# Patient Record
Sex: Male | Born: 1935 | Race: Black or African American | Hispanic: No | Marital: Married
Health system: Midwestern US, Academic
[De-identification: ages and names within clinical notes are randomized; demographics above are authoritative.]

---

## 2017-06-25 ENCOUNTER — Encounter: Admit: 2017-06-25 | Discharge: 2017-06-25 | Payer: MEDICARE

## 2017-06-25 ENCOUNTER — Ambulatory Visit: Admit: 2017-06-25 | Discharge: 2017-06-25 | Payer: MEDICARE

## 2017-06-25 DIAGNOSIS — N281 Cyst of kidney, acquired: Principal | ICD-10-CM

## 2017-06-25 DIAGNOSIS — N401 Enlarged prostate with lower urinary tract symptoms: ICD-10-CM

## 2017-06-25 DIAGNOSIS — Q540 Hypospadias, balanic: ICD-10-CM

## 2017-06-25 DIAGNOSIS — I2510 Atherosclerotic heart disease of native coronary artery without angina pectoris: ICD-10-CM

## 2017-06-25 DIAGNOSIS — F209 Schizophrenia, unspecified: ICD-10-CM

## 2017-06-25 DIAGNOSIS — E119 Type 2 diabetes mellitus without complications: ICD-10-CM

## 2017-06-25 DIAGNOSIS — K635 Polyp of colon: ICD-10-CM

## 2017-06-25 DIAGNOSIS — I10 Essential (primary) hypertension: ICD-10-CM

## 2017-06-25 DIAGNOSIS — M549 Dorsalgia, unspecified: ICD-10-CM

## 2017-06-25 DIAGNOSIS — E669 Obesity, unspecified: ICD-10-CM

## 2017-06-25 DIAGNOSIS — M1990 Unspecified osteoarthritis, unspecified site: Secondary | ICD-10-CM

## 2017-06-25 DIAGNOSIS — I509 Heart failure, unspecified: ICD-10-CM

## 2017-06-25 DIAGNOSIS — F319 Bipolar disorder, unspecified: ICD-10-CM

## 2017-06-25 DIAGNOSIS — D649 Anemia, unspecified: ICD-10-CM

## 2017-06-25 DIAGNOSIS — D696 Thrombocytopenia, unspecified: Principal | ICD-10-CM

## 2017-06-25 DIAGNOSIS — J189 Pneumonia, unspecified organism: ICD-10-CM

## 2017-06-25 DIAGNOSIS — N400 Enlarged prostate without lower urinary tract symptoms: ICD-10-CM

## 2017-06-25 DIAGNOSIS — R161 Splenomegaly, not elsewhere classified: ICD-10-CM

## 2017-06-25 DIAGNOSIS — Z9861 Coronary angioplasty status: ICD-10-CM

## 2017-06-25 DIAGNOSIS — E785 Hyperlipidemia, unspecified: ICD-10-CM

## 2017-06-29 NOTE — Progress Notes
Date of Service: 06/25/2017     Subjective:             Glenn Montgomery is a 83 y.o. male.    History of Present Illness  Here for follow up of renal mass    Doing well  Denies complaints  Denies pain, hematuria, dysuria  Denies fatigue, wt loss         Review of Systems   Constitutional: Negative for activity change, appetite change, chills and fever.   Respiratory: Negative for cough and shortness of breath.    Cardiovascular: Negative for chest pain and leg swelling.   Gastrointestinal: Negative for abdominal pain, constipation and diarrhea.   Genitourinary: Negative for dysuria, enuresis, flank pain, frequency, hematuria, penile pain and testicular pain.   Musculoskeletal: Negative for joint swelling.   Skin: Negative for rash and wound.   Neurological: Negative for syncope, weakness, light-headedness and headaches.   Hematological: Negative for adenopathy.   Psychiatric/Behavioral: Negative for confusion.         Objective:         ??? acetaminophen (TYLENOL EXTRA STRENGTH) 500 mg tablet Take 500 mg by mouth twice daily as needed for Pain.   ??? amiodarone (CORDARONE) 200 mg tablet Take 200 mg by mouth daily.   ??? aspirin 81 mg chewable tablet Take 81 mg by mouth daily.   ??? dextran 70/hypromellose (NATURAL BALANCE TEARS) 0.1/0.3 % ophthalmic solution Place 1 Drop into or around eye(s) as Needed.   ??? docusate (COLACE) 100 mg capsule Take 200 mg by mouth daily as needed for Constipation.   ??? fluticasone (FLONASE) 50 mcg/actuation nasal spray Apply 2 Sprays to each nostril as directed daily. Shake bottle gently before using.   ??? furosemide (LASIX) 40 mg tablet Take 40 mg by mouth daily.   ??? gabapentin (NEURONTIN) 100 mg capsule Take 100 mg by mouth three times daily.   ??? HELP MEDICATION KCL 20Meq/52ml.  4 tablespoons daily   ??? hydrALAZINE (APRESOLINE) 10 mg tablet Take 10 mg by mouth three times daily. Indications: take one tab when bp is greater than 160/95   ??? losartan (COZAAR) 25 mg tablet ??? LOSARTAN PO Take  by mouth.   ??? melatonin(+) 5 mg TbDL rapid dissolve tablet Dissolve 5 mg by mouth as Needed.   ??? metoprolol XL (TOPROL XL) 100 mg tablet Take 100 mg by mouth daily.   ??? mirtazapine (REMERON) 15 mg tablet Take 15 mg by mouth at bedtime daily.   ??? multivit-min-FA-lycopen-lutein (CENTRUM SILVER ULTRA MEN'S) 300-600-300 mcg tab Take 1 Tab by mouth daily.   ??? Omega-3 Fatty Acids-Vitamin E (FISH OIL) 1,000 mg cap Take 1 Cap by mouth twice daily.   ??? other medication Take 1 Dose by mouth as Needed. Medication Name & Strength:thicket  Dose(how many): powder    Frequency(how often): as needed with liquids   Indications: needs to have thickner in liquids to help with dysphagia   ??? Potassium Chloride 40 mEq/15 mL liqd Take 40 mEq by mouth twice daily with meals.     Vitals:    06/25/17 1344   BP: 131/68   Pulse: 64   Weight: 86.2 kg (190 lb)   Height: 172.7 cm (67.99)     Body mass index is 28.9 kg/m???.     Physical Exam   Constitutional: He is oriented to person, place, and time. No distress.   Eyes: Pupils are equal, round, and reactive to light.   Neck: Neck supple.  Cardiovascular: Normal rate and regular rhythm.    Pulmonary/Chest: No respiratory distress.   Abdominal: Soft. He exhibits no distension and no mass. There is no tenderness.   Musculoskeletal: He exhibits no edema.   Neurological: He is alert and oriented to person, place, and time.   Skin: Skin is warm. No erythema.            Assessment and Plan:    Problem   Renal Cysts, Acquired, Bilateral    Renal/ Bladder US (06/15/2015): incidental (B) renal cysts.  (L) hypoechoic cystic lesion 3.2 cm, 1.8 cm complex cyst.  (R) 2.5 cm cyst with increased echogenicity, 3.2 cm mass with internal echoes, mild peripheral vascularity.    No recent imaging         Renal cysts, acquired, bilateral  Overall well  Will obtain CT next available  Will call with results        Daryl Eastern, MD

## 2017-06-29 NOTE — Assessment & Plan Note
Overall well  Will obtain CT next available  Will call with results

## 2017-07-12 ENCOUNTER — Ambulatory Visit: Admit: 2017-07-12 | Discharge: 2017-07-12 | Payer: MEDICARE

## 2017-07-12 DIAGNOSIS — N281 Cyst of kidney, acquired: Principal | ICD-10-CM

## 2017-07-12 LAB — POC CREATININE, RAD: Lab: 1.2 mg/dL (ref 0.4–1.24)

## 2017-07-12 MED ORDER — IOPAMIDOL 76 % IV SOLN
80 mL | Freq: Once | INTRAVENOUS | 0 refills | Status: CP
Start: 2017-07-12 — End: ?
  Administered 2017-07-12: 21:00:00 80 mL via INTRAVENOUS

## 2017-07-12 MED ORDER — SODIUM CHLORIDE 0.9 % IJ SOLN
50 mL | Freq: Once | INTRAVENOUS | 0 refills | Status: CP
Start: 2017-07-12 — End: ?
  Administered 2017-07-12: 21:00:00 50 mL via INTRAVENOUS

## 2017-07-19 ENCOUNTER — Encounter: Admit: 2017-07-19 | Discharge: 2017-07-19 | Payer: MEDICARE

## 2017-07-19 DIAGNOSIS — N2889 Other specified disorders of kidney and ureter: Principal | ICD-10-CM

## 2017-07-19 NOTE — Telephone Encounter
I reviewed the results with the patient and his wife.  There has been interval growth of the tumors.  We reviewed observation, biopsy, and treatment.  He has elected to continue observation.  RTC 6 months with CT AP.

## 2018-03-25 ENCOUNTER — Encounter: Admit: 2018-03-25 | Discharge: 2018-03-25 | Payer: MEDICARE

## 2018-03-25 ENCOUNTER — Ambulatory Visit: Admit: 2018-03-25 | Discharge: 2018-03-25 | Payer: MEDICARE

## 2018-03-25 DIAGNOSIS — R161 Splenomegaly, not elsewhere classified: ICD-10-CM

## 2018-03-25 DIAGNOSIS — N401 Enlarged prostate with lower urinary tract symptoms: ICD-10-CM

## 2018-03-25 DIAGNOSIS — E669 Obesity, unspecified: ICD-10-CM

## 2018-03-25 DIAGNOSIS — D696 Thrombocytopenia, unspecified: Principal | ICD-10-CM

## 2018-03-25 DIAGNOSIS — I10 Essential (primary) hypertension: ICD-10-CM

## 2018-03-25 DIAGNOSIS — K635 Polyp of colon: ICD-10-CM

## 2018-03-25 DIAGNOSIS — M1990 Unspecified osteoarthritis, unspecified site: ICD-10-CM

## 2018-03-25 DIAGNOSIS — I509 Heart failure, unspecified: ICD-10-CM

## 2018-03-25 DIAGNOSIS — Q540 Hypospadias, balanic: ICD-10-CM

## 2018-03-25 DIAGNOSIS — N281 Cyst of kidney, acquired: Principal | ICD-10-CM

## 2018-03-25 DIAGNOSIS — E119 Type 2 diabetes mellitus without complications: ICD-10-CM

## 2018-03-25 DIAGNOSIS — N400 Enlarged prostate without lower urinary tract symptoms: ICD-10-CM

## 2018-03-25 DIAGNOSIS — J189 Pneumonia, unspecified organism: ICD-10-CM

## 2018-03-25 DIAGNOSIS — F319 Bipolar disorder, unspecified: ICD-10-CM

## 2018-03-25 DIAGNOSIS — E785 Hyperlipidemia, unspecified: ICD-10-CM

## 2018-03-25 DIAGNOSIS — F209 Schizophrenia, unspecified: ICD-10-CM

## 2018-03-25 DIAGNOSIS — N2889 Other specified disorders of kidney and ureter: Principal | ICD-10-CM

## 2018-03-25 DIAGNOSIS — M549 Dorsalgia, unspecified: ICD-10-CM

## 2018-03-25 DIAGNOSIS — D649 Anemia, unspecified: ICD-10-CM

## 2018-03-25 DIAGNOSIS — I2510 Atherosclerotic heart disease of native coronary artery without angina pectoris: ICD-10-CM

## 2018-03-25 DIAGNOSIS — Z9861 Coronary angioplasty status: ICD-10-CM

## 2018-03-25 LAB — POC CREATININE, RAD: Lab: 1.3 mg/dL — ABNORMAL HIGH (ref 0.4–1.24)

## 2018-03-25 MED ORDER — SODIUM CHLORIDE 0.9 % IJ SOLN
50 mL | Freq: Once | INTRAVENOUS | 0 refills | Status: CP
Start: 2018-03-25 — End: ?
  Administered 2018-03-25: 17:00:00 50 mL via INTRAVENOUS

## 2018-03-25 MED ORDER — IOHEXOL 350 MG IODINE/ML IV SOLN
100 mL | Freq: Once | INTRAVENOUS | 0 refills | Status: CP
Start: 2018-03-25 — End: ?
  Administered 2018-03-25: 17:00:00 100 mL via INTRAVENOUS

## 2018-03-26 ENCOUNTER — Encounter: Admit: 2018-03-26 | Discharge: 2018-03-26 | Payer: MEDICARE

## 2018-09-24 ENCOUNTER — Encounter: Admit: 2018-09-24 | Discharge: 2018-09-24 | Payer: MEDICARE

## 2018-10-01 ENCOUNTER — Encounter: Admit: 2018-10-01 | Discharge: 2018-10-01 | Payer: MEDICARE

## 2018-10-01 ENCOUNTER — Ambulatory Visit: Admit: 2018-10-01 | Discharge: 2018-10-01 | Payer: MEDICARE

## 2018-10-01 DIAGNOSIS — K635 Polyp of colon: ICD-10-CM

## 2018-10-01 DIAGNOSIS — N401 Enlarged prostate with lower urinary tract symptoms: ICD-10-CM

## 2018-10-01 DIAGNOSIS — F319 Bipolar disorder, unspecified: ICD-10-CM

## 2018-10-01 DIAGNOSIS — Z9861 Coronary angioplasty status: ICD-10-CM

## 2018-10-01 DIAGNOSIS — J189 Pneumonia, unspecified organism: ICD-10-CM

## 2018-10-01 DIAGNOSIS — Q540 Hypospadias, balanic: ICD-10-CM

## 2018-10-01 DIAGNOSIS — I2510 Atherosclerotic heart disease of native coronary artery without angina pectoris: ICD-10-CM

## 2018-10-01 DIAGNOSIS — N2889 Other specified disorders of kidney and ureter: ICD-10-CM

## 2018-10-01 DIAGNOSIS — F209 Schizophrenia, unspecified: ICD-10-CM

## 2018-10-01 DIAGNOSIS — D696 Thrombocytopenia, unspecified: Principal | ICD-10-CM

## 2018-10-01 DIAGNOSIS — E669 Obesity, unspecified: ICD-10-CM

## 2018-10-01 DIAGNOSIS — N400 Enlarged prostate without lower urinary tract symptoms: ICD-10-CM

## 2018-10-01 DIAGNOSIS — D649 Anemia, unspecified: ICD-10-CM

## 2018-10-01 DIAGNOSIS — R161 Splenomegaly, not elsewhere classified: ICD-10-CM

## 2018-10-01 DIAGNOSIS — E119 Type 2 diabetes mellitus without complications: ICD-10-CM

## 2018-10-01 DIAGNOSIS — I10 Essential (primary) hypertension: ICD-10-CM

## 2018-10-01 DIAGNOSIS — E785 Hyperlipidemia, unspecified: ICD-10-CM

## 2018-10-01 DIAGNOSIS — N281 Cyst of kidney, acquired: Principal | ICD-10-CM

## 2018-10-01 DIAGNOSIS — M1990 Unspecified osteoarthritis, unspecified site: Secondary | ICD-10-CM

## 2018-10-01 DIAGNOSIS — I509 Heart failure, unspecified: ICD-10-CM

## 2018-10-01 DIAGNOSIS — M549 Dorsalgia, unspecified: ICD-10-CM

## 2018-10-01 LAB — POC CREATININE, RAD: Lab: 1.3 mg/dL — ABNORMAL HIGH (ref 0.4–1.24)

## 2018-10-01 MED ORDER — SODIUM CHLORIDE 0.9 % IJ SOLN
50 mL | Freq: Once | INTRAVENOUS | 0 refills | Status: CP
Start: 2018-10-01 — End: ?
  Administered 2018-10-01: 18:00:00 50 mL via INTRAVENOUS

## 2018-10-01 MED ORDER — IOHEXOL 350 MG IODINE/ML IV SOLN
100 mL | Freq: Once | INTRAVENOUS | 0 refills | Status: CP
Start: 2018-10-01 — End: ?
  Administered 2018-10-01: 18:00:00 100 mL via INTRAVENOUS

## 2018-12-02 ENCOUNTER — Encounter: Admit: 2018-12-02 | Discharge: 2018-12-02 | Payer: MEDICARE

## 2018-12-02 DIAGNOSIS — Q540 Hypospadias, balanic: ICD-10-CM

## 2018-12-02 DIAGNOSIS — Z9861 Coronary angioplasty status: ICD-10-CM

## 2018-12-02 DIAGNOSIS — I10 Essential (primary) hypertension: ICD-10-CM

## 2018-12-02 DIAGNOSIS — D649 Anemia, unspecified: ICD-10-CM

## 2018-12-02 DIAGNOSIS — J189 Pneumonia, unspecified organism: ICD-10-CM

## 2018-12-02 DIAGNOSIS — E669 Obesity, unspecified: ICD-10-CM

## 2018-12-02 DIAGNOSIS — F209 Schizophrenia, unspecified: ICD-10-CM

## 2018-12-02 DIAGNOSIS — R161 Splenomegaly, not elsewhere classified: ICD-10-CM

## 2018-12-02 DIAGNOSIS — I509 Heart failure, unspecified: ICD-10-CM

## 2018-12-02 DIAGNOSIS — M549 Dorsalgia, unspecified: ICD-10-CM

## 2018-12-02 DIAGNOSIS — M1990 Unspecified osteoarthritis, unspecified site: Secondary | ICD-10-CM

## 2018-12-02 DIAGNOSIS — E119 Type 2 diabetes mellitus without complications: ICD-10-CM

## 2018-12-02 DIAGNOSIS — F319 Bipolar disorder, unspecified: ICD-10-CM

## 2018-12-02 DIAGNOSIS — N401 Enlarged prostate with lower urinary tract symptoms: ICD-10-CM

## 2018-12-02 DIAGNOSIS — I2510 Atherosclerotic heart disease of native coronary artery without angina pectoris: ICD-10-CM

## 2018-12-02 DIAGNOSIS — K635 Polyp of colon: ICD-10-CM

## 2018-12-02 DIAGNOSIS — N400 Enlarged prostate without lower urinary tract symptoms: ICD-10-CM

## 2018-12-02 DIAGNOSIS — E785 Hyperlipidemia, unspecified: ICD-10-CM

## 2018-12-02 DIAGNOSIS — D696 Thrombocytopenia, unspecified: Principal | ICD-10-CM

## 2019-01-13 ENCOUNTER — Encounter: Admit: 2019-01-13 | Discharge: 2019-01-13 | Payer: MEDICARE

## 2019-02-18 ENCOUNTER — Encounter: Admit: 2019-02-18 | Discharge: 2019-02-18 | Payer: MEDICARE

## 2019-02-18 DIAGNOSIS — Z95810 Presence of automatic (implantable) cardiac defibrillator: Principal | ICD-10-CM

## 2019-02-19 ENCOUNTER — Encounter: Admit: 2019-02-19 | Discharge: 2019-02-19 | Payer: MEDICARE

## 2019-02-19 DIAGNOSIS — I509 Heart failure, unspecified: ICD-10-CM

## 2019-02-19 DIAGNOSIS — E785 Hyperlipidemia, unspecified: ICD-10-CM

## 2019-02-19 DIAGNOSIS — I10 Essential (primary) hypertension: ICD-10-CM

## 2019-02-19 DIAGNOSIS — I472 Ventricular tachycardia: ICD-10-CM

## 2019-02-19 DIAGNOSIS — I2510 Atherosclerotic heart disease of native coronary artery without angina pectoris: ICD-10-CM

## 2019-02-19 DIAGNOSIS — F319 Bipolar disorder, unspecified: ICD-10-CM

## 2019-02-19 DIAGNOSIS — M1990 Unspecified osteoarthritis, unspecified site: Principal | ICD-10-CM

## 2019-02-23 ENCOUNTER — Encounter: Admit: 2019-02-23 | Discharge: 2019-02-23 | Payer: MEDICARE

## 2019-02-23 DIAGNOSIS — I509 Heart failure, unspecified: ICD-10-CM

## 2019-02-23 DIAGNOSIS — N400 Enlarged prostate without lower urinary tract symptoms: ICD-10-CM

## 2019-02-23 DIAGNOSIS — K635 Polyp of colon: ICD-10-CM

## 2019-02-23 DIAGNOSIS — E785 Hyperlipidemia, unspecified: ICD-10-CM

## 2019-02-23 DIAGNOSIS — N401 Enlarged prostate with lower urinary tract symptoms: ICD-10-CM

## 2019-02-23 DIAGNOSIS — M549 Dorsalgia, unspecified: ICD-10-CM

## 2019-02-23 DIAGNOSIS — J189 Pneumonia, unspecified organism: ICD-10-CM

## 2019-02-23 DIAGNOSIS — I2510 Atherosclerotic heart disease of native coronary artery without angina pectoris: ICD-10-CM

## 2019-02-23 DIAGNOSIS — F209 Schizophrenia, unspecified: ICD-10-CM

## 2019-02-23 DIAGNOSIS — D649 Anemia, unspecified: ICD-10-CM

## 2019-02-23 DIAGNOSIS — F319 Bipolar disorder, unspecified: Secondary | ICD-10-CM

## 2019-02-23 DIAGNOSIS — D696 Thrombocytopenia, unspecified: Principal | ICD-10-CM

## 2019-02-23 DIAGNOSIS — Q540 Hypospadias, balanic: ICD-10-CM

## 2019-02-23 DIAGNOSIS — E119 Type 2 diabetes mellitus without complications: ICD-10-CM

## 2019-02-23 DIAGNOSIS — R161 Splenomegaly, not elsewhere classified: ICD-10-CM

## 2019-02-23 DIAGNOSIS — E669 Obesity, unspecified: ICD-10-CM

## 2019-02-23 DIAGNOSIS — M1990 Unspecified osteoarthritis, unspecified site: Secondary | ICD-10-CM

## 2019-02-23 DIAGNOSIS — Z9861 Coronary angioplasty status: ICD-10-CM

## 2019-02-23 DIAGNOSIS — I472 Ventricular tachycardia: ICD-10-CM

## 2019-02-23 DIAGNOSIS — I10 Essential (primary) hypertension: ICD-10-CM

## 2019-02-25 ENCOUNTER — Encounter: Admit: 2019-02-25 | Discharge: 2019-02-25 | Payer: MEDICARE

## 2019-02-25 ENCOUNTER — Ambulatory Visit: Admit: 2019-02-25 | Discharge: 2019-02-25 | Payer: MEDICARE

## 2019-02-25 DIAGNOSIS — I472 Ventricular tachycardia: ICD-10-CM

## 2019-02-25 DIAGNOSIS — J189 Pneumonia, unspecified organism: ICD-10-CM

## 2019-02-25 DIAGNOSIS — E119 Type 2 diabetes mellitus without complications: Secondary | ICD-10-CM

## 2019-02-25 DIAGNOSIS — N401 Enlarged prostate with lower urinary tract symptoms: ICD-10-CM

## 2019-02-25 DIAGNOSIS — M549 Dorsalgia, unspecified: ICD-10-CM

## 2019-02-25 DIAGNOSIS — I5022 Chronic systolic (congestive) heart failure: Principal | ICD-10-CM

## 2019-02-25 DIAGNOSIS — D649 Anemia, unspecified: ICD-10-CM

## 2019-02-25 DIAGNOSIS — N400 Enlarged prostate without lower urinary tract symptoms: ICD-10-CM

## 2019-02-25 DIAGNOSIS — I255 Ischemic cardiomyopathy: ICD-10-CM

## 2019-02-25 DIAGNOSIS — Z9861 Coronary angioplasty status: ICD-10-CM

## 2019-02-25 DIAGNOSIS — E669 Obesity, unspecified: ICD-10-CM

## 2019-02-25 DIAGNOSIS — K635 Polyp of colon: ICD-10-CM

## 2019-02-25 DIAGNOSIS — I10 Essential (primary) hypertension: ICD-10-CM

## 2019-02-25 DIAGNOSIS — E785 Hyperlipidemia, unspecified: Secondary | ICD-10-CM

## 2019-02-25 DIAGNOSIS — Q540 Hypospadias, balanic: ICD-10-CM

## 2019-02-25 DIAGNOSIS — R161 Splenomegaly, not elsewhere classified: ICD-10-CM

## 2019-02-25 DIAGNOSIS — I2510 Atherosclerotic heart disease of native coronary artery without angina pectoris: ICD-10-CM

## 2019-02-25 DIAGNOSIS — I509 Heart failure, unspecified: ICD-10-CM

## 2019-02-25 DIAGNOSIS — D696 Thrombocytopenia, unspecified: Principal | ICD-10-CM

## 2019-02-25 DIAGNOSIS — F209 Schizophrenia, unspecified: ICD-10-CM

## 2019-02-25 DIAGNOSIS — M1990 Unspecified osteoarthritis, unspecified site: Secondary | ICD-10-CM

## 2019-02-25 DIAGNOSIS — F319 Bipolar disorder, unspecified: Secondary | ICD-10-CM

## 2019-02-25 DIAGNOSIS — Z95810 Presence of automatic (implantable) cardiac defibrillator: Secondary | ICD-10-CM

## 2019-02-25 MED ORDER — AMIODARONE 200 MG PO TAB
400 mg | ORAL_TABLET | Freq: Every day | ORAL | 1 refills | 42.00000 days | Status: AC
Start: 2019-02-25 — End: 2019-06-12

## 2019-02-25 MED ORDER — METOPROLOL SUCCINATE 100 MG PO TB24
150 mg | ORAL_TABLET | Freq: Every day | ORAL | 3 refills | 90.00000 days | Status: AC
Start: 2019-02-25 — End: 2019-05-29

## 2019-02-25 NOTE — Progress Notes
He says he is in a wheelchair from injuring his back in the [employer]. At home he states he uses his walker predominantly.     His blood pressure has been elevated at home. His wife says that yesterday it was high but it fluctuates.     He says he had a massive heart attack in 1986.    He denies any chest discomfort, palpitations, lightheadedness, dizziness, near syncope or syncope.    FHx, SHx and ROS documented and I have reviewed, with some pertinent features to include:  No FHx of premature CAD. 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 Most pertinent ROS is included/discussed throughout the note, e.g. HPI and A/P.      ASSESSMENT AND PLAN:    -- SOA/DOE  -- Recurrent VT???Below VT Detection  -- Chronic Systolic CHF  -- Ischemic Cardiomyopathy  -- LV Aneurysm  -- SJM Dual Coil DDDR ICD (Originally Implanted by Dr. 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  Unfortunately did not have any cardiac records for my review.    I spent over 100 minutes of his 120+ minute office visit in counseling, discussion about recommendations, reviewing the results of his device interrogation and VT, establishing and conveying a plan to them, etc.    Glenn Montgomery reports symptoms of SOA that is intermittent in particular really orthopnea.  He does have some dyspnea on exertion.  It is difficult determine if that is really changed from his baseline however.  His wife states he is minimally active.  However he states around the house he uses a walker not a wheelchair.    It is somewhat difficult to determine his functional status since he is very inactive.    Both he and his wife state that it has been quite sometime since he has been hospitalized for heart failure.    I would say that he is likely NYHA Functional Class II???III.    We have no documentation of any recent imaging.  In fact the patient and his wife states it has been years since he has had anything done.  Therefore we will obtain a baseline rest echo Doppler.    On exam he does have some Rt basilar inspiratory crackles, and right lower extremity edema.  We will check a BNP in addition to other labs as outlined below and determine whether to change his therapy.  We also discussed weighing daily, with parameters and details described below.    We ALSO discussed a potential CRT-D upgrade.  However, patient and his wife are not eager for any type of surgery or procedures at this time.    In December he had an episode of Sustained VT that did not get adequately treated for some time until it finally crossed his VT detection long enough to warrant the full sequence of therapies.  Unfortunately, it was hovering below his VT zone often.    Regarding his sustained VT we  will check labs to include a magnesium level, etc. as outlined below.  We will also pursue a Regadenosine Thallium Stress Test.  With regard to interpreting the stress test and a plan based on the results?????? I recognize that he has a CTO of his LAD with collateralization.  In particular, I would like to know if he has ischemia while on medical therapy.  If the stress test is abnormal suggesting of significant ischemia and he did not hold his medications prior to the test then we will need to further optimize his antianginal type medications.  Therefore, again, we will not hold any medications for his stress test.     Also regarding the sustained VT, we Will Increase His Amiodarone to 400 Mg greater than 3lbs overnight or 5 lbs in one week.  To Exercise 3-4 hours per week recommended.  To Follow low sodium dietary restriction- 2000mg  daily and to Call for any worsening symptoms of shortness of breath, swelling, sudden weight gain, lightheadedness, heart racing or chest pain.  --He was instructed to:  Weigh yourself every day and write it down in a log and if he gains > 2 pounds in 24 hours, 4 pounds or more over 48 hours, or > 5 pounds in one week, then he should call our office or if already discussed by your doctor, then you can double his Lasix (and potassium) dose until he is back to his dry weight.   If this takes more than 3 days, then he was instructed to call our office.      Glenn Montgomery was educated regarding plan of care. He was instructed to call our office with any questions or concerns, as well as to notify us of any new or worsening symptoms. He verbalized understanding.   I appreciate the opportunity to participate in the care of your patient.  Please do not hesitate to contact me directly if you have any questions or further insights into his care.  I have scheduled his follow-up with APP in 4 months and me in 6 month(s).         Vitals:    02/25/19 1037   BP: 110/66   BP Source: Arm, Left Upper   Pulse: 62   Height: 1.727 m (5' 8)   PainSc: Zero     Body mass index is 27.83 kg/m???.     Past Medical History  Patient Active Problem List    Diagnosis Date Noted   ??? OA (osteoarthritis) 02/23/2019   ??? HTN (hypertension) 02/23/2019   ??? HLD (hyperlipidemia) 02/23/2019   ??? Bipolar disorder (HCC) 02/23/2019   ??? Coronary artery disease involving native coronary artery of native heart 02/23/2019     01/24/2015 - Cardiac Catheterization:  [facility])  100% CTO of the LAD with right to left collaterals.  Aneurysmal anteroapical wall with EF = 20%.  No MVR.  No AVS.  Normal LVEDP.     ??? DM (diabetes mellitus) (HCC) 02/23/2019   ??? CHF (congestive heart failure) (HCC) 02/23/2019 ??? VT (ventricular tachycardia) (HCC) 02/23/2019   ??? Obesity (BMI 30-39.9)    ??? 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 Hematuria 07/02/2016   ??? History of urethral stricture, bulbar      Urethral stricture dilation -- June 2017.     ??? BPH with obstruction/lower urinary tract symptoms      (+)ho prostate procedure ~ 1990's, possible TUMT, per pt's wife's description.    10/01/18: evaluation Gershon Cull, PA-C     ???  Renal cysts, acquired, bilateral 06/24/2015     Renal/ Bladder US (06/15/2015): incidental (B) renal cysts.  (L) hypoechoic cystic lesion 3.2 cm, 1.8 cm complex cyst.  (R) 2.5 cm cyst with increased echogenicity, 3.2 cm mass with internal echoes, mild peripheral vascularity.    03/25/18: CT without significant change    10/01/18: CT Abd/Pel with and without contrast 10/01/18 ~No significant change in bilateral renal carcinomas, see report, referred to review with PMD and Cardiology cardiac findings     ??? Thrombocytopenia, unspecified (HCC) 07/16/2013         Review of Systems   Constitution: Negative.   HENT: Negative.    Eyes: Negative.    Cardiovascular: Negative.    Respiratory: Negative.    Endocrine: Negative.    Hematologic/Lymphatic: Negative.    Skin: Negative.    Musculoskeletal: Negative.    Gastrointestinal: Negative.    Genitourinary: Negative.    Neurological: Positive for excessive daytime sleepiness.   Psychiatric/Behavioral: The patient has insomnia.    Allergic/Immunologic: Negative.        Physical Exam  Constitutional: He is in no acute distress, resting comfortably. Patient in wheelchair on presentation  Skin/Integument:  Warm and dry  Eyes:  PERRL, sclera are non-icteric and no xanthelasmas noted   ENT :  Hearing is intact, Oropharynx is clear and moist.   Heme/Lym/Immun:  Supple neck, without thyromegaly   Respiratory-Pulmonary/Chest:  Effort normal and breath sounds normal. No respiratory distress or accessory muscle use. No obvious tracheal core vue impedance was elevated through mid December.     Problems Addressed Today  No diagnosis found.           Current Medications (including today's revisions)  ??? acetaminophen (TYLENOL EXTRA STRENGTH) 500 mg tablet Take 500 mg by mouth twice daily as needed for Pain.   ??? amiodarone (CORDARONE) 200 mg tablet Take 200 mg by mouth daily.   ??? aspirin 81 mg chewable tablet Take 81 mg by mouth daily.   ??? Carboxymethylcellulose Sodium 0.5 % drop Place 2 drops into or around eye(s) four times daily as needed (both eyes).   ??? cholecalciferol (VITAMIN D-3) 400 unit tab tablet Take 400 Units by mouth daily.   ??? dextran 70/hypromellose (NATURAL BALANCE TEARS) 0.1/0.3 % ophthalmic solution Place 1 Drop into or around eye(s) as Needed.   ??? docusate (COLACE) 100 mg capsule Take 200 mg by mouth daily as needed for Constipation.   ??? duloxetine DR (CYMBALTA) 30 mg capsule Take 30 mg by mouth daily.   ??? fish oil /omega-3 fatty acids (SEA-OMEGA) 340/1000 mg capsule Take 2 capsules by mouth.   ??? fluticasone (FLONASE) 50 mcg/actuation nasal spray Apply 2 Sprays to each nostril as directed daily. Shake bottle gently before using.   ??? furosemide (LASIX) 40 mg tablet Take 40 mg by mouth daily.   ??? hydrALAZINE (APRESOLINE) 10 mg tablet Take 10 mg by mouth three times daily. Indications: take one tab when bp is greater than 160/95   ??? losartan (COZAAR) 50 mg tablet Take 50 mg by mouth daily.   ??? melatonin(+) 5 mg TbDL rapid dissolve tablet Dissolve 5 mg by mouth as Needed.   ??? metoprolol XL (TOPROL XL) 100 mg tablet Take 100 mg by mouth daily.   ??? mirtazapine (REMERON) 7.5 mg tablet Take 7.5 mg by mouth at bedtime daily.   ??? multivit-min-FA-lycopen-lutein (CENTRUM SILVER ULTRA MEN'S) 300-600-300 mcg tab Take 1 Tab by mouth daily.   ??? Omega-3 Fatty Acids-Vitamin E (FISH  OIL) 1,000 mg cap Take 1 Cap by mouth twice daily.   ??? other medication Take 1 Dose by mouth as Needed. Medication Name &

## 2019-02-25 NOTE — Telephone Encounter
I contacted patient's spouse to let her know we had not received cardiology records.  His wife's concern is that he is in a wheel chair and tires easily and does not want to have him spend too much time answering questions.  She states he has transportation scheduled and She is going to call Dr Gilford Silvius office and give them our fax #.   We have records from his PCP which give cardiac history. Problem list updated.  I called Mrs Bonny back to let her know we have sufficient records for him.  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      I told her that he will have device check in office today and I will ask remote team to request transfer of remote care to our practice.     No further questions/concerns.

## 2019-02-25 NOTE — Telephone Encounter
-----   Message from Fleet Contras, RN sent at 02/24/2019 10:40 PM CST -----  Regarding: OV on 02/26 - need records  I have requested, re-requested and called Dr. Duanne Moron office.    Can you please follow-up so we are able to get more records for OV on 02/26.    (p) [phone]

## 2019-02-26 ENCOUNTER — Encounter: Admit: 2019-02-26 | Discharge: 2019-02-26 | Payer: MEDICARE

## 2019-02-27 LAB — MAGNESIUM: Lab: 2.7 — ABNORMAL HIGH (ref 1.6–2.3)

## 2019-02-27 LAB — BNP (B-TYPE NATRIURETIC PEPTI): Lab: 264 U/L — ABNORMAL HIGH (ref 0–100)

## 2019-02-28 LAB — CBC AND DIFF
Lab: 13 g/dL
Lab: 25 — ABNORMAL LOW (ref 26.6–33.0)
Lab: 4.9
Lab: 42 mg/dL
Lab: 5.2 g/dL
Lab: 81 U/L

## 2019-02-28 LAB — COMPREHENSIVE METABOLIC PANEL
Lab: 0.3
Lab: 1.1
Lab: 104
Lab: 12
Lab: 14
Lab: 14
Lab: 145 — ABNORMAL HIGH (ref 134–144)
Lab: 29 — ABNORMAL LOW (ref 1.1–3.5)
Lab: 4 mL/min/{1.73_m2} — ABNORMAL LOW (ref 150–450)
Lab: 4.1
Lab: 54 — ABNORMAL LOW (ref >60)
Lab: 6
Lab: 63
Lab: 72
Lab: 89
Lab: 9.3

## 2019-03-02 ENCOUNTER — Encounter: Admit: 2019-03-02 | Discharge: 2019-03-02 | Payer: MEDICARE

## 2019-03-02 DIAGNOSIS — I472 Ventricular tachycardia: Principal | ICD-10-CM

## 2019-03-02 DIAGNOSIS — I10 Essential (primary) hypertension: ICD-10-CM

## 2019-03-02 DIAGNOSIS — I2510 Atherosclerotic heart disease of native coronary artery without angina pectoris: ICD-10-CM

## 2019-03-03 ENCOUNTER — Encounter: Admit: 2019-03-03 | Discharge: 2019-03-03 | Payer: MEDICARE

## 2019-03-13 ENCOUNTER — Encounter: Admit: 2019-03-13 | Discharge: 2019-03-13 | Payer: MEDICARE

## 2019-03-13 ENCOUNTER — Ambulatory Visit: Admit: 2019-03-13 | Discharge: 2019-03-14 | Payer: MEDICARE

## 2019-03-13 DIAGNOSIS — R161 Splenomegaly, not elsewhere classified: ICD-10-CM

## 2019-03-13 DIAGNOSIS — M1990 Unspecified osteoarthritis, unspecified site: ICD-10-CM

## 2019-03-13 DIAGNOSIS — N400 Enlarged prostate without lower urinary tract symptoms: ICD-10-CM

## 2019-03-13 DIAGNOSIS — E669 Obesity, unspecified: ICD-10-CM

## 2019-03-13 DIAGNOSIS — F209 Schizophrenia, unspecified: ICD-10-CM

## 2019-03-13 DIAGNOSIS — N401 Enlarged prostate with lower urinary tract symptoms: ICD-10-CM

## 2019-03-13 DIAGNOSIS — F319 Bipolar disorder, unspecified: ICD-10-CM

## 2019-03-13 DIAGNOSIS — Q540 Hypospadias, balanic: ICD-10-CM

## 2019-03-13 DIAGNOSIS — K635 Polyp of colon: ICD-10-CM

## 2019-03-13 DIAGNOSIS — I2510 Atherosclerotic heart disease of native coronary artery without angina pectoris: ICD-10-CM

## 2019-03-13 DIAGNOSIS — Z9861 Coronary angioplasty status: ICD-10-CM

## 2019-03-13 DIAGNOSIS — J189 Pneumonia, unspecified organism: ICD-10-CM

## 2019-03-13 DIAGNOSIS — I10 Essential (primary) hypertension: Secondary | ICD-10-CM

## 2019-03-13 DIAGNOSIS — D696 Thrombocytopenia, unspecified: Principal | ICD-10-CM

## 2019-03-13 DIAGNOSIS — D649 Anemia, unspecified: ICD-10-CM

## 2019-03-13 DIAGNOSIS — E785 Hyperlipidemia, unspecified: Secondary | ICD-10-CM

## 2019-03-13 DIAGNOSIS — I509 Heart failure, unspecified: ICD-10-CM

## 2019-03-13 DIAGNOSIS — M549 Dorsalgia, unspecified: ICD-10-CM

## 2019-03-13 DIAGNOSIS — E119 Type 2 diabetes mellitus without complications: ICD-10-CM

## 2019-03-13 DIAGNOSIS — I472 Ventricular tachycardia: ICD-10-CM

## 2019-03-13 MED ORDER — SPIRONOLACTONE 25 MG PO TAB
25 mg | ORAL_TABLET | Freq: Every day | ORAL | 3 refills | 90.00000 days | Status: AC
Start: 2019-03-13 — End: ?

## 2019-03-13 NOTE — Progress Notes
Date of Service: 03/13/2019    Glenn Montgomery is a 85 y.o. male.       HPI     Glenn Montgomery is seen today in the heart failure Glenn Montgomery is seen today in the heart failure and transplant clinic at [facility].  He has been referred by Dr. Venita Montgomery for management of his heart failure.  He has a history of ischemic cardiomyopathy status post heart attack in 1986 with subsequent heart failure diagnosed in 2014.  He has been seeing Dr. Venita Montgomery for management of his ventricular tachycardia.  He is known to have a CTO of his LAD and ventricular aneurysm.  In addition he has hypertension, hyperlipidemia, thrombocytopenia, CKD, schizophrenia with bipolar and restless leg syndrome.  He presenting today at the clinic with his wife on a wheelchair.    At home he is able to ambulate with a walker.  He states that when he goes from room to room he might get occasionally short of breath.  He denies any shortness of breath at baseline or at rest.  He does have some chest pain of her his left nipple which lasts maybe for 5 minutes at rest and then goes away.  He denies any palpitations, orthopnea, PND.  He has been told before that he has some lower extremity swelling.  He denies any lightheadedness or dizziness.    With regards to his ventricular tachycardia he has been seen by Dr. Venita Montgomery who increase his amiodarone dose and his Toprol dose to 150 mg daily.  He describes having a shock from his defibrillator about a month ago when he did not seek medical attention.  He has been ordered to have nuclear stress test and an echocardiogram as records from [place] have not been available.    Today in clinic his heart rate is 77 bpm and his blood pressure is 115/46.  His weight is 84.4 kg.  His most recent labs reveal a hemoglobin of 13.5, white blood cell of 4.9 and a platelet count of 89.  His sodium was 145 with a potassium of 4 and a creatinine of 1.19.  His BNP was slightly elevated at 264.  His magnesium was 2.7.       Vitals: Skin: Positive for itching and rash.   Gastrointestinal: Negative.    Neurological: Negative.    Psychiatric/Behavioral: The patient has insomnia.    Allergic/Immunologic: Negative.        Physical Exam   Constitutional: No distress.   Patient appears to be somewhat cachectic and is hard of hearing sitting in a wheelchair.   HENT:   Head: Normocephalic and atraumatic.   Eyes: Pupils are equal, round, and reactive to light. EOM are normal.   Neck: No JVD present.   Cardiovascular: Normal rate, regular rhythm and normal heart sounds. Exam reveals no gallop.   No murmur heard.  Pulmonary/Chest: Effort normal and breath sounds normal.   Musculoskeletal:         General: Edema (+1 edema up to his mid shins) present.   Neurological: He is alert and oriented to person, place, and time.   Skin: Skin is warm and dry.       Cardiovascular Studies  There is no cardiac imaging testing available.    Problems Addressed Today  Encounter Diagnoses   Name Primary?   ??? Congestive heart failure, unspecified HF chronicity, unspecified heart failure type (HCC)    ??? VT (ventricular tachycardia) (HCC)    ??? Coronary artery  disease involving native coronary artery of native heart, angina presence unspecified        Assessment and Plan       CHF (congestive heart failure) (HCC)  -Patient reporting currently symptoms of most likely New York Heart Association class III although it is difficult to assess given his limited activity at home.  -No available echocardiogram or stress test but it appears that patient has severe cardiomyopathy with a decreased ejection fraction.  -He is currently on Toprol 150 mg daily, losartan 50 mg, furosemide 40 mg daily  -We will add Aldactone 25 mg daily in an effort to further diurese and to optimize goal-directed medical therapy.  -Given recent outbreak of coronavirus we will reschedule his echo and stress test for later this year    VT (ventricular tachycardia) (HCC) -Patient has been followed currently by Dr. Venita Montgomery for management of his ventricular tachycardia.  -Has been on increased dose of amiodarone p.o. and increased dose of Toprol.  -Rest of plan per EP    Coronary artery disease involving native coronary artery of native heart  -Denies any current chest pains.  He is on aspirin 81 without any statin  -We will need to clarify the reason for not being on a statin and potentially restarted.    Follow-up with Glenn Montgomery in 2 to 3 months and with Glenn Montgomery in 3 to 6 months    Glenn Montgomery PGY5  Fellow in Cardiovascular diseases  Pager number: [phone]  Patient seen and discussed with GlennTrayven Montgomery    I personally interviewed and examined the patient. I have reviewed the history, physical examination, impression and plan as outlined by the resident/fellow and I concur unless otherwise noted.    Dr. Naoma Montgomery will work on the VT and we will work to optimize GDMT. We would like to try Entresto but will start spironolactone first since he has volume overload on exam today.     Total time 25 minutes. Estimated counseling time 15 minutes including risks/benefits/alternatives discussion related to plan as outlined and answering all questions related to the care plan while educating on the importance of adherence to recommended therapies, outpatient follow-up, and also addressing prognosis specific to the patient/family concerns.    Current Medications (including today's revisions)  ??? acetaminophen (TYLENOL EXTRA STRENGTH) 500 mg tablet Take 500 mg by mouth twice daily as needed for Pain.   ??? amiodarone (CORDARONE) 200 mg tablet Take two tablets by mouth daily. for 90 days, then cut back to 1 tablet daily   ??? aspirin 81 mg chewable tablet Take 81 mg by mouth daily.   ??? Carboxymethylcellulose Sodium 0.5 % drop Place 2 drops into or around eye(s) four times daily as needed (both eyes).   ??? cholecalciferol (VITAMIN D-3) 400 unit tab tablet Take 400 Units by mouth daily.

## 2019-03-13 NOTE — Patient Instructions
Thank you for coming to [facility]. Your instructions today:     1.  Recommendations:   - we will add another medication to your regimen called spironolactone 25 mg daily  - We will re-arrange your echocardiogram and stress test for a later time   2.  Next follow up appointment in 2-3 months with [name] and 3-6 months with Dr.Sauer    If you wish to contact us, please call and leave a message for the heart failure nurses at [phone].   To schedule or change an appointment call [phone].    Lamont Snowball, MD  [name], APRN  Oralia Rud, RN  [facility]  Phone: [phone] Fax: [phone]    Your Heart Failure Symptom Awareness and Action Plan  Every Day Action Plan  ??? Weigh yourself in the morning before breakfast. Write it down and compare it to yesterday's weight.  ??? Take your medicine, as prescribed. Please call if you have concerns about the side effects, cost or refills.  ??? Check for worsened swelling in your feet, ankles and stomach  ??? Follow a 2000mg  salt diet.  ??? Keep all healthcare appointments    Green Zone   Good! Symptoms are under control ??? No shortness of breath  ??? No increase in ankle swelling  ??? No weight gain  ??? No chest pain  ??? No change in your usual activity  ??? Continue to follow your everyday action plan.    Yellow Zone  If you have any of these symptoms, please call the heart failure nurses: [phone] ??? Increased shortness of breath with activity  ??? Weight gain of 3 pounds in one day or 5 pounds in a week  ??? Increased swelling in your ankles or legs  ??? Increased swelling in your stomach  ??? Increasing fatigue  ??? You may need an adjustment of your medications.    Red Zone  These are urgent symptoms. Please call the heart failure nurses: [phone] ??? Shortness of breath at rest or waking up at night feeling short of breath or coughing

## 2019-03-13 NOTE — Assessment & Plan Note
-  Patient reporting currently symptoms of most likely New York Heart Association class III although it is difficult to assess given his limited activity at home.  -No available echocardiogram or stress test but it appears that patient has severe cardiomyopathy with a decreased ejection fraction.  -He is currently on Toprol 150 mg daily, losartan 50 mg, furosemide 40 mg daily  -We will add Aldactone 25 mg daily in an effort to further diurese and to optimize goal-directed medical therapy.  -Given recent outbreak of coronavirus we will reschedule his echo and stress test for later this year

## 2019-03-14 DIAGNOSIS — I509 Heart failure, unspecified: ICD-10-CM

## 2019-03-14 DIAGNOSIS — I5089 Other heart failure: Principal | ICD-10-CM

## 2019-03-14 DIAGNOSIS — I2510 Atherosclerotic heart disease of native coronary artery without angina pectoris: ICD-10-CM

## 2019-03-18 ENCOUNTER — Encounter: Admit: 2019-03-18 | Discharge: 2019-03-18 | Payer: MEDICARE

## 2019-03-23 ENCOUNTER — Encounter: Admit: 2019-03-23 | Discharge: 2019-03-23 | Payer: MEDICARE

## 2019-03-30 ENCOUNTER — Encounter: Admit: 2019-03-30 | Discharge: 2019-03-30 | Payer: MEDICARE

## 2019-03-30 NOTE — Telephone Encounter
-----   Message from Golda Acre, LPN sent at 1/61/0960 10:19 AM CDT -----  Regarding: MPE- needs  new Rx.  VM from wife [phone] on triage line.  He has 3 pills left since it was increased.  He needs new Rx for Amiodarone sent to pharmacy.   He has 3 pills left since it was increased.

## 2019-04-02 ENCOUNTER — Encounter: Admit: 2019-04-02 | Discharge: 2019-04-02 | Payer: MEDICARE

## 2019-04-02 MED ORDER — POTASSIUM CHLORIDE 20 MEQ PO PACK
Freq: Two times a day (BID) | 0 refills | 30.00000 days | Status: DC
Start: 2019-04-02 — End: 2019-05-20

## 2019-04-18 ENCOUNTER — Encounter: Admit: 2019-04-18 | Discharge: 2019-04-18 | Payer: MEDICARE

## 2019-04-20 NOTE — Telephone Encounter
pts spouse called to follow up call from weekend:    Patient Status  patient is an established patient with [facility].        Signs and Symptoms  he's feeling good today   No SOA   No swelling   Pt had constipation recently        Medication Review  Amiodarone 200 mg BID   - taking correctly   Lasix 40 mg daily    - taking correctly    hydralazine 10 mg TID  - spouse stopped 2 weeks ago, took 10 mg after elevated BP on 4/18  Losartan 50 mg daily  - spouse stopped 2 weeks ago, took 50 mg  After elevated BP on 4/18  metoprolol 150 mg daily  - taking correctly   Potassium 20 meq BID  - taking correctly   Spiro 25 mg daily   - spouse stopped 4/17     For sleep:  Melatonin   risperiDONE 3 mg tablets, pt taking 1-1.5 tablets daily            Fluid and Sodium Intake  Strongly encouraged low sodium diet   Drinking over 60 oz daily, encouraged to follow fluid guidelines           No recent weights, informed a scale and daily weights would be important     Date Weight B/P Pulse   4/20  107/55 60   4/19      4/18  86/47  125/68 53  65   4/17  103/54  125/67

## 2019-04-20 NOTE — Telephone Encounter
pts spouse called back, asks if pt should take hydralazine 10 mg TID or 10 mg daily.

## 2019-04-26 ENCOUNTER — Encounter: Admit: 2019-04-26 | Discharge: 2019-04-26 | Payer: MEDICARE

## 2019-05-05 ENCOUNTER — Encounter: Admit: 2019-05-05 | Discharge: 2019-05-05 | Payer: MEDICARE

## 2019-05-05 NOTE — Telephone Encounter
Pts spouse called reporting pt is feeling lethargic and tired. Called to assess:      Patient Status  patient is an established patient with [facility].        Signs and Symptoms  Sleeping a lot more  Lethargic, out of it   comes and goes, is not happening daily   probably every other day  Been happening since before 3/13 appt with [name]     No dizziness or light headedness     Pt stays up until until 4 AM every night, some times gets up at 9 AM and sometimes sleeps all day.        Medication Review  Lasix 40 mg daily   Hydralazine 10 mg TID--- not taking unless BP is above 160  Losartan 50 mg daily   Melatonin 5 mg PRN---- pt 6 mg melatonin at 10:30-11:00 PM, pt gets to sleep at 4 AM.   Metoprolol 150 mg daily   mirtazapine 7.5 mg daily   risperiDONE 3 mg daily--- is to be reduced by psychiatrist to 1.5 mg daily due to lethargy   Spiro 25 mg daily   CYMBALTA 30 mg daily        Fluid and Sodium Intake  Patient is drinking under  64 oz of fluid daily       Date Weight B/P Pulse   5/5  110/62  124/73 62  63   5/4  136/72 60         Discussed with spouse and pt that pt shoud try to get up at the same time each day and try to sleep at an earlier time. Pt advised to try to avoid sleeping during the day at all.

## 2019-05-17 ENCOUNTER — Encounter: Admit: 2019-05-17 | Discharge: 2019-05-17 | Payer: MEDICARE

## 2019-05-20 ENCOUNTER — Encounter: Admit: 2019-05-20 | Discharge: 2019-05-20 | Payer: MEDICARE

## 2019-05-20 MED ORDER — POTASSIUM CHLORIDE 20 MEQ PO PACK
Freq: Two times a day (BID) | 0 refills | 30.00000 days | Status: DC
Start: 2019-05-20 — End: 2019-07-02

## 2019-05-29 ENCOUNTER — Encounter: Admit: 2019-05-29 | Discharge: 2019-05-29 | Payer: MEDICARE

## 2019-05-29 MED ORDER — METOPROLOL SUCCINATE 100 MG PO TB24
50 mg | ORAL_TABLET | Freq: Every day | ORAL | 3 refills | 90.00000 days | Status: DC
Start: 2019-05-29 — End: 2019-06-12

## 2019-05-29 NOTE — Telephone Encounter
Reviewed with [name]. Orders given to DC Hydralazine.    Returned call to patient and spoke with the wife.  She states she is concerned with his lethargy.  Patient hasn't been taking his Hydralazine.  She states the patient is sleeping a lot. She states this has been going on for a while, off and on.  I asked the patient to reach out to the [place] concerning his sleep and the medications he is taking for that. She states she will.    Reviewed with [name] again and orders given to decrease Toprol XL to 50mg  daily.      Returned call to patient and spoke with patient's wife.  She states that she has been giving the patient his spironolactone and furosemide at night.  I recommend that she give the patient his diuretics in the morning and she could give the Toprol XL in the evening.  She repeated the instructions back to me and states she is writing them down.

## 2019-06-01 NOTE — Telephone Encounter
pts spouse called reporting pt has improved BP but still has hypotension at night. Spouse reports BP was 94/47 at night and then she provides pt water and some meal shakes to help with this.

## 2019-06-01 NOTE — Telephone Encounter
Spouse left another message asking about doing a tele visit on 6/18 instead of in person visit.

## 2019-06-01 NOTE — Telephone Encounter
Called and spoke with patients wife.   Got update on patients blood pressures. She was worried that a reading last night was 96/48, blood pressure was taken in the middle of the night while patient was just waking up to go to the bathroom. Pts wife has no readings of his BP in the morning after he has been awake. Instructed her to not take BP in the middle of the night, unless patient is symptomatic (dizzy/lightheaded). Pt to take his BP at the same time every morning.     Regarding doing telehealth. Pt's spouse said she has a computer they can use, it has speakers but no camera or microphone. No email address set up either.   Spouse will set up email address and figure out if computer has camera and microphone. Once she does these three things, she will call us to finish setting up mychart for telehealth.     Spouse will call back in a few days with update.

## 2019-06-05 ENCOUNTER — Encounter: Admit: 2019-06-05 | Discharge: 2019-06-05

## 2019-06-05 NOTE — Telephone Encounter
Spouse called in reporting BP readings. She read off BP readings BP running 110-120's/60-70.  In the evening,(at HS) BP is running 90-100/50-60.  Toprol decreased from 150 mg daily to 50 mg daily on 5/29--patient now taking Toprol at HS.  Spouse reports they have been checking BP in the night as well and the BP is "going low during the night," reporting several BP reading with SBP 80's. Patient is monitoring BP multiple times during the nigh The BP cuff recorded 65/46 last night.. Both the patient and the spouse "slept through it" and are not aware of any sxs.  Explained that the BP reading may not be accurate r/t cuff position, arm posture etc. BP this am 126/64,  HR 73.  Will forward to Dr Kinnie Scales" team to follow up and advise

## 2019-06-11 ENCOUNTER — Encounter: Admit: 2019-06-11 | Discharge: 2019-06-11

## 2019-06-11 NOTE — Telephone Encounter
-----   Message from Louis Meckel, LPN sent at 8/82/8003 11:28 AM CDT -----  Regarding: MPE- new Rx.  VM from wife Kendrick Fries [phone] on triage line.  Said that he can get his medications at the [facility] for free.  Could we send his Rx to the [facility] [phone].  They need his social security # on Rx and OV notes also to go with Rx.

## 2019-06-11 NOTE — Telephone Encounter
[facility]  [facility] and clinic notes actually need to be faxed to his clinic  that fax number is:  [phone]  will print rx while Dr Larina Bras in clinic tomorrow then fax together with office visit notes

## 2019-06-12 ENCOUNTER — Encounter: Admit: 2019-06-12 | Discharge: 2019-06-12

## 2019-06-12 MED ORDER — AMIODARONE 200 MG PO TAB
400 mg | ORAL_TABLET | Freq: Every day | ORAL | 3 refills | 42.00000 days | Status: DC
Start: 2019-06-12 — End: 2019-06-12

## 2019-06-12 MED ORDER — METOPROLOL SUCCINATE 100 MG PO TB24
50 mg | ORAL_TABLET | Freq: Every day | ORAL | 3 refills | 90.00000 days | Status: DC
Start: 2019-06-12 — End: 2019-06-12

## 2019-06-12 MED ORDER — AMIODARONE 200 MG PO TAB
200 mg | ORAL_TABLET | Freq: Every day | ORAL | 3 refills | 42.00000 days | Status: DC
Start: 2019-06-12 — End: 2020-02-19

## 2019-06-12 MED ORDER — METOPROLOL SUCCINATE 100 MG PO TB24
50 mg | ORAL_TABLET | Freq: Every day | ORAL | 3 refills | 90.00000 days | Status: AC
Start: 2019-06-12 — End: ?

## 2019-06-15 ENCOUNTER — Encounter: Admit: 2019-06-15 | Discharge: 2019-06-15

## 2019-06-15 DIAGNOSIS — I5023 Acute on chronic systolic (congestive) heart failure: Secondary | ICD-10-CM

## 2019-06-15 NOTE — Telephone Encounter
Pts spouse was instructed for NUC, no caffeine-including chocolate 24 hours prior to test, don't eat/drink after midnight except for water and it is a two part test. Also gave pt instructions and phone number for safe check-in. Pts spouse verbalized understanding.

## 2019-06-15 NOTE — Progress Notes
Okay per Sonia Baller to change appt on 6/18 to telehealth.  Call to patient to discuss.  Talked to patient's wife and stated she will have labs drawn tomorrow when she takes patient out for stress test and ECHO.   Orders placed and provided instructions for patient to go to the [facility] on [address], let her know that she will just need to get a copy of the lab orders while at [facility] and take with her to [facility].  Verbalized understanding.  Message sent to scheduling to change to [facility]

## 2019-06-16 ENCOUNTER — Encounter: Admit: 2019-06-16 | Discharge: 2019-06-16

## 2019-06-16 ENCOUNTER — Ambulatory Visit: Admit: 2019-06-16 | Discharge: 2019-06-17

## 2019-06-16 ENCOUNTER — Ambulatory Visit: Admit: 2019-06-16 | Discharge: 2019-06-16

## 2019-06-16 DIAGNOSIS — I472 Ventricular tachycardia: Principal | ICD-10-CM

## 2019-06-16 DIAGNOSIS — I10 Essential (primary) hypertension: Secondary | ICD-10-CM

## 2019-06-16 DIAGNOSIS — I2510 Atherosclerotic heart disease of native coronary artery without angina pectoris: Secondary | ICD-10-CM

## 2019-06-16 MED ORDER — EUCALYPTUS-MENTHOL MM LOZG
1 | Freq: Once | ORAL | 0 refills | Status: AC | PRN
Start: 2019-06-16 — End: ?

## 2019-06-16 MED ORDER — SODIUM CHLORIDE 0.9 % IV SOLP
250 mL | INTRAVENOUS | 0 refills | Status: AC | PRN
Start: 2019-06-16 — End: ?

## 2019-06-16 MED ORDER — NITROGLYCERIN 0.4 MG SL SUBL
.4 mg | SUBLINGUAL | 0 refills | Status: DC | PRN
Start: 2019-06-16 — End: 2019-06-21

## 2019-06-16 MED ORDER — PERFLUTREN LIPID MICROSPHERES 1.1 MG/ML IV SUSP
1-20 mL | Freq: Once | INTRAVENOUS | 0 refills | Status: CP | PRN
Start: 2019-06-16 — End: ?
  Administered 2019-06-16: 15:00:00 5 mL via INTRAVENOUS

## 2019-06-16 MED ORDER — ALBUTEROL SULFATE 90 MCG/ACTUATION IN HFAA
2 | RESPIRATORY_TRACT | 0 refills | Status: DC | PRN
Start: 2019-06-16 — End: 2019-06-21

## 2019-06-16 MED ORDER — REGADENOSON 0.4 MG/5 ML IV SYRG
.4 mg | Freq: Once | INTRAVENOUS | 0 refills | Status: CP
Start: 2019-06-16 — End: ?
  Administered 2019-06-16: 16:00:00 0.4 mg via INTRAVENOUS

## 2019-06-16 MED ORDER — AMINOPHYLLINE 500 MG/20 ML IV SOLN
50 mg | INTRAVENOUS | 0 refills | Status: AC | PRN
Start: 2019-06-16 — End: ?
  Administered 2019-06-16: 16:00:00 50 mg via INTRAVENOUS

## 2019-06-16 NOTE — Telephone Encounter
Attempted to call patient and confirm tele health appointment on 6/18 but no answer.  Will try again later.

## 2019-06-16 NOTE — Telephone Encounter
Recieved message from [facility], pt was at lab and there were no orders. Faxed BMP order to requested lab.

## 2019-06-17 ENCOUNTER — Encounter: Admit: 2019-06-17 | Discharge: 2019-06-17

## 2019-06-17 DIAGNOSIS — I5023 Acute on chronic systolic (congestive) heart failure: Secondary | ICD-10-CM

## 2019-06-17 DIAGNOSIS — I2510 Atherosclerotic heart disease of native coronary artery without angina pectoris: Secondary | ICD-10-CM

## 2019-06-17 NOTE — Telephone Encounter
Late entry:   This RN was contacted via skype by Elvia Collum, RN and got message in the inbasket.   Called to f/u with patient and reinforced points of good sleep hygiene, when to take blood pressures and importance of being consistent. Reviewed with physician in clinic, no new orders provided as low BP was taken while patient and spouse both sleeping. Pt to bring BP cuff to next OV with make sure cuff size accurate.

## 2019-06-17 NOTE — Telephone Encounter
Cherylann Banas, MD  Mickle Mallory, MD; Gentry Roch, APRN-NP; Emert, Vilma Prader, MD; Everlene Other Nurse Hf Team [facility]   Cc: Eulas Post, [facility] team. Can we check to see if patient is on anticoagulation?     Lets arrange follow-up echo / repeat to see if any progression in 2 weeks.     Please also ask about fevers, chills or any new symptoms. We may need to bring to clinic.     Thanks    Previous Messages      ----- Message -----   From: Mickle Mallory, MD   Sent: 06/16/2019  4:35 PM CDT   To: Cherylann Banas, MD, Merla Riches, *   Subject: echo today                      On subcostal views #70, #71, and #72, a ~1.2x0.5 cm mobile structure is seen contiguous with the RV portion of the device lead. It may be a thrombus or fibrous tissue.         Attempted to call patient x3, someone would answer phone, but no one would be talking. Attempted to call back and went to voicemail. Will have to follow up with patient this afternoon.   . Per our records, pt not on anticoagulation, was going to confirm with patient on phone.   Order placed for echo in 2 weeks.   Pt will need to come into clinic to see Gentry Roch, APRN, if no fever/chills.   From previous encounters, asked that patient bring BP cuff into clinic to make sure that cuff fit appropriately.

## 2019-06-17 NOTE — Progress Notes
Called and spoke with patients wife, BMP was drawn yesterday. [place] left detailed message for them to fax results asap and asked Kenney Houseman to fax urgent request for lab results to [facility] at [phone]

## 2019-06-18 ENCOUNTER — Encounter: Admit: 2019-06-18 | Discharge: 2019-06-18

## 2019-06-18 ENCOUNTER — Ambulatory Visit: Admit: 2019-06-18 | Discharge: 2019-06-18

## 2019-06-18 DIAGNOSIS — N179 Acute kidney failure, unspecified: Secondary | ICD-10-CM

## 2019-06-18 DIAGNOSIS — K635 Polyp of colon: Secondary | ICD-10-CM

## 2019-06-18 DIAGNOSIS — D696 Thrombocytopenia, unspecified: Secondary | ICD-10-CM

## 2019-06-18 DIAGNOSIS — M549 Dorsalgia, unspecified: Secondary | ICD-10-CM

## 2019-06-18 DIAGNOSIS — E785 Hyperlipidemia, unspecified: Secondary | ICD-10-CM

## 2019-06-18 DIAGNOSIS — R161 Splenomegaly, not elsewhere classified: Secondary | ICD-10-CM

## 2019-06-18 DIAGNOSIS — I255 Ischemic cardiomyopathy: Secondary | ICD-10-CM

## 2019-06-18 DIAGNOSIS — I513 Intracardiac thrombosis, not elsewhere classified: Secondary | ICD-10-CM

## 2019-06-18 DIAGNOSIS — E119 Type 2 diabetes mellitus without complications: Secondary | ICD-10-CM

## 2019-06-18 DIAGNOSIS — I5020 Unspecified systolic (congestive) heart failure: Principal | ICD-10-CM

## 2019-06-18 DIAGNOSIS — I472 Ventricular tachycardia: Secondary | ICD-10-CM

## 2019-06-18 DIAGNOSIS — F209 Schizophrenia, unspecified: Secondary | ICD-10-CM

## 2019-06-18 DIAGNOSIS — N400 Enlarged prostate without lower urinary tract symptoms: Secondary | ICD-10-CM

## 2019-06-18 DIAGNOSIS — F319 Bipolar disorder, unspecified: Secondary | ICD-10-CM

## 2019-06-18 DIAGNOSIS — I2510 Atherosclerotic heart disease of native coronary artery without angina pectoris: Secondary | ICD-10-CM

## 2019-06-18 DIAGNOSIS — Z9861 Coronary angioplasty status: Secondary | ICD-10-CM

## 2019-06-18 DIAGNOSIS — M1990 Unspecified osteoarthritis, unspecified site: Secondary | ICD-10-CM

## 2019-06-18 DIAGNOSIS — I10 Essential (primary) hypertension: Secondary | ICD-10-CM

## 2019-06-18 DIAGNOSIS — I509 Heart failure, unspecified: Secondary | ICD-10-CM

## 2019-06-18 DIAGNOSIS — N401 Enlarged prostate with lower urinary tract symptoms: Secondary | ICD-10-CM

## 2019-06-18 DIAGNOSIS — Q540 Hypospadias, balanic: Secondary | ICD-10-CM

## 2019-06-18 DIAGNOSIS — D649 Anemia, unspecified: Secondary | ICD-10-CM

## 2019-06-18 DIAGNOSIS — N183 Chronic kidney disease, stage 3 (moderate): Secondary | ICD-10-CM

## 2019-06-18 DIAGNOSIS — E669 Obesity, unspecified: Secondary | ICD-10-CM

## 2019-06-18 DIAGNOSIS — J189 Pneumonia, unspecified organism: Secondary | ICD-10-CM

## 2019-06-18 MED ORDER — APIXABAN 5 MG PO TAB
5 mg | ORAL_TABLET | Freq: Two times a day (BID) | ORAL | 1 refills | Status: DC
Start: 2019-06-18 — End: 2019-06-18

## 2019-06-18 MED ORDER — APIXABAN 5 MG PO TAB
5 mg | ORAL_TABLET | Freq: Two times a day (BID) | ORAL | 1 refills | Status: AC
Start: 2019-06-18 — End: ?

## 2019-06-18 NOTE — Telephone Encounter
-----   Message from Buckner Malta sent at 06/18/2019  1:20 PM CDT -----  Regarding: Remote transfer, Merlin  Good afternoon,    I talked with this patient's wife and they are currently enrolled at another clinic for [facility]. We requested transfer in February but it was never given to [place]. I was told to forward to you about further proceedings so this patient can send [place] remotes. Let me know if there's anything I should do to speed this up. Thank you.  ----- Message -----  From: Thomasene Mohair, RN  Sent: 06/18/2019  12:39 PM CDT  To: Cherlyn Labella Remote      ----- Message -----  From: Georgiann Mccoy Lexine Baton) M, APRN-NP  Sent: 06/18/2019  12:04 PM CDT  To: Cvm Nurse Ep    I saw this pt via telehealth. Wife states he has a merlin at home but there were no transmissions. I know they have a check scheduled in a couple of weeks at [facility] but she was hoping to be able to send a remote, someone may want to call if that is possible. Thanks. Nikki  ----- Message -----  From: Georgiann Mccoy Lexine Baton) M, APRN-NP  Sent: 06/18/2019  10:23 AM CDT  To: Cvm Nurse Ep    This pt had device check in 01/2019, saw in the report there was a RV lead recall. Does he need a device check today? Doesn't appear to have a remote transmitter.

## 2019-06-19 ENCOUNTER — Ambulatory Visit: Admit: 2019-06-18 | Discharge: 2019-06-18

## 2019-06-19 ENCOUNTER — Ambulatory Visit: Admit: 2019-06-18 | Discharge: 2019-06-19

## 2019-06-19 DIAGNOSIS — N183 Chronic kidney disease, stage 3 (moderate): Secondary | ICD-10-CM

## 2019-06-19 DIAGNOSIS — I513 Intracardiac thrombosis, not elsewhere classified: Secondary | ICD-10-CM

## 2019-06-19 DIAGNOSIS — I2510 Atherosclerotic heart disease of native coronary artery without angina pectoris: Secondary | ICD-10-CM

## 2019-06-19 DIAGNOSIS — N179 Acute kidney failure, unspecified: Secondary | ICD-10-CM

## 2019-06-22 NOTE — Progress Notes
Please notify patient that stress test showed no ischemia and both test is echo and this confirmed his severe LV dysfunction.  He is following up with heart failure appropriately.  Stress imaging obtained to assess for ischemia given patient's recurrent VT.  He has a known CTO of the LAD.  His stress test showed his severe LV dysfunction and a large area of scar involving his anterior apex.  This is consistent with his known CAD.    He also underwent echocardiogram which showed apical akinesis/dyskinesis and EF of 25%, etc.  There was no severe critical valve disease.    I had increased his amiodarone at the time of the office visit and reprogrammed some device detections.  He is following up with heart failure going forward.

## 2019-06-23 ENCOUNTER — Encounter: Admit: 2019-06-23 | Discharge: 2019-06-23

## 2019-06-23 NOTE — Telephone Encounter
Lab order faxed to [facility] [address] per pts spouse request.

## 2019-06-24 MED ORDER — FUROSEMIDE 20 MG PO TAB
20 mg | ORAL_TABLET | Freq: Every morning | ORAL | 3 refills | 90.00000 days | Status: DC
Start: 2019-06-24 — End: 2019-06-30

## 2019-06-24 NOTE — Telephone Encounter
Spoke with pts spouse, medication list updated to reflect pt is taking lasix 20 mg daily. Per 6/18 OV with APRN Gentry Roch:    "- Hold furosemide and spironolactone for 2 days, then continue spironolactone at 25 mg daily but decrease furosemide to 20 mg daily."

## 2019-06-25 ENCOUNTER — Encounter: Admit: 2019-06-25 | Discharge: 2019-06-25

## 2019-06-25 DIAGNOSIS — I255 Ischemic cardiomyopathy: Secondary | ICD-10-CM

## 2019-06-25 DIAGNOSIS — Z95810 Presence of automatic (implantable) cardiac defibrillator: Secondary | ICD-10-CM

## 2019-06-25 LAB — BASIC METABOLIC PANEL
Lab: 1.6 — ABNORMAL HIGH (ref 0.76–1.27)
Lab: 106
Lab: 119 — ABNORMAL HIGH (ref 65–99)
Lab: 142
Lab: 22
Lab: 24
Lab: 37 — ABNORMAL LOW (ref >59)
Lab: 4.5
Lab: 43 — ABNORMAL LOW (ref >59)

## 2019-06-25 NOTE — Telephone Encounter
-----   Message from Ezekiel Ina sent at 06/18/2019  2:58 PM CDT -----  Regarding: FW: Remote transfer, Merlin  Pt to call heart clinic for remote monitoring release.  Has pt been released to our [place] network?  ----- Message -----  From: Buckner Malta  Sent: 06/18/2019   1:20 PM CDT  To: Ezekiel Ina  Subject: Remote transfer, Kavin Leech afternoon,    I talked with this patient's wife and they are currently enrolled at another clinic for [facility]. We requested transfer in February but it was never given to [place]. I was told to forward to you about further proceedings so this patient can send [place] remotes. Let me know if there's anything I should do to speed this up. Thank you.  ----- Message -----  From: Thomasene Mohair, RN  Sent: 06/18/2019  12:39 PM CDT  To: Cherlyn Labella Remote      ----- Message -----  From: Georgiann Mccoy Lexine Baton) M, APRN-NP  Sent: 06/18/2019  12:04 PM CDT  To: Cvm Nurse Ep    I saw this pt via telehealth. Wife states he has a merlin at home but there were no transmissions. I know they have a check scheduled in a couple of weeks at [facility] but she was hoping to be able to send a remote, someone may want to call if that is possible. Thanks. Nikki  ----- Message -----  From: Georgiann Mccoy Lexine Baton) M, APRN-NP  Sent: 06/18/2019  10:23 AM CDT  To: Cvm Nurse Ep    This pt had device check in 01/2019, saw in the report there was a RV lead recall. Does he need a device check today? Doesn't appear to have a remote transmitter.

## 2019-06-25 NOTE — Telephone Encounter
Lab order faxed again per pts spouse request.

## 2019-06-26 ENCOUNTER — Encounter: Admit: 2019-06-26 | Discharge: 2019-06-26

## 2019-06-26 DIAGNOSIS — I2510 Atherosclerotic heart disease of native coronary artery without angina pectoris: Secondary | ICD-10-CM

## 2019-06-26 DIAGNOSIS — I255 Ischemic cardiomyopathy: Secondary | ICD-10-CM

## 2019-06-26 DIAGNOSIS — N183 Chronic kidney disease, stage 3 (moderate): Secondary | ICD-10-CM

## 2019-06-26 DIAGNOSIS — N179 Acute kidney failure, unspecified: Secondary | ICD-10-CM

## 2019-06-26 DIAGNOSIS — I513 Intracardiac thrombosis, not elsewhere classified: Secondary | ICD-10-CM

## 2019-06-26 DIAGNOSIS — I5020 Unspecified systolic (congestive) heart failure: Secondary | ICD-10-CM

## 2019-06-26 DIAGNOSIS — I472 Ventricular tachycardia: Secondary | ICD-10-CM

## 2019-06-26 NOTE — Telephone Encounter
called and talked with Glenn Montgomery who had multiple questions.  She had been reading the report on MyChart and went over each word..     she verbalized understanding,

## 2019-06-26 NOTE — Telephone Encounter
-----   Message from Berenice Primas, MD sent at 06/22/2019 10:22 AM CDT -----  Please notify patient that stress test showed no ischemia and both test is echo and this confirmed his severe LV dysfunction.  He is following up with heart failure appropriately.  Stress imaging obtained to assess for ischemia given patient's recurrent VT.  He has a known CTO of the LAD.  His stress test showed his severe LV dysfunction and a large area of scar involving his anterior apex.  This is consistent with his known CAD.    He also underwent echocardiogram which showed apical akinesis/dyskinesis and EF of 25%, etc.  There was no severe critical valve disease.    I had increased his amiodarone at the time of the office visit and reprogrammed some device detections.  He is following up with heart failure going forward.

## 2019-06-29 ENCOUNTER — Encounter: Admit: 2019-06-29 | Discharge: 2019-06-29

## 2019-06-29 NOTE — Telephone Encounter
Message   Received: Yesterday   Message Contents            Associated Results   Georgiann Mccoy Lexine Baton) M, APRN-NP  P Cvm Nurse Hf Team [facility]            No further changes at this time. Nikki      Result Notes for BASIC METABOLIC PANEL     Notes recorded by Salli Real, APRN-NP on 06/28/2019 at 5:49 PM CDT  Patient was seen by [facility] 6/18. Creatinine improved and BUN down. Will send to [facility] to see if she wants any further changes.  ------    Notes recorded by Angelyn Punt, RN on 06/26/2019 at 11:42 AM CDT  repeat labs for your review after OV 6/18 with med changes:   then continue spironolactone at 25 mg daily but decrease furosemide to 20 mg daily.    Pt has OV with [facility] on 7/10. Please advise any additional med changes prior to OV    Thanks,  [employer]

## 2019-06-30 ENCOUNTER — Encounter: Admit: 2019-06-30 | Discharge: 2019-06-30

## 2019-06-30 ENCOUNTER — Ambulatory Visit: Admit: 2019-06-30 | Discharge: 2019-06-30

## 2019-06-30 DIAGNOSIS — I472 Ventricular tachycardia: Secondary | ICD-10-CM

## 2019-06-30 DIAGNOSIS — I2510 Atherosclerotic heart disease of native coronary artery without angina pectoris: Secondary | ICD-10-CM

## 2019-06-30 DIAGNOSIS — I10 Essential (primary) hypertension: Secondary | ICD-10-CM

## 2019-06-30 DIAGNOSIS — I5023 Acute on chronic systolic (congestive) heart failure: Secondary | ICD-10-CM

## 2019-06-30 DIAGNOSIS — I255 Ischemic cardiomyopathy: Secondary | ICD-10-CM

## 2019-06-30 MED ORDER — ENTRESTO 24-26 MG PO TAB
1 | ORAL_TABLET | Freq: Two times a day (BID) | ORAL | 11 refills | Status: DC
Start: 2019-06-30 — End: 2019-09-04

## 2019-06-30 MED ORDER — FUROSEMIDE 20 MG PO TAB
20 mg | ORAL_TABLET | ORAL | 3 refills | 90.00000 days | Status: CN | PRN
Start: 2019-06-30 — End: ?

## 2019-06-30 MED ORDER — ENTRESTO 24-26 MG PO TAB
1 | ORAL_TABLET | Freq: Two times a day (BID) | ORAL | 0 refills | Status: DC
Start: 2019-06-30 — End: 2019-06-30
  Filled 2019-07-02: qty 60, 30d supply, fill #1

## 2019-06-30 NOTE — Telephone Encounter
-----   Message from Mendel Ryder, RN sent at 06/30/2019 11:09 AM CDT -----  Regarding: FW: possible ongoing fl overload/HF    ----- Message -----  From: Olena Mater, RN  Sent: 06/30/2019  10:59 AM CDT  To: Cvm Nurse Hf Team [facility]  Subject: possible ongoing fl overload/HF                  In-office check today showing possible fl overload/HF ongoing for ~11days.    Please f/u as needed.    Thanks,  Kaylee/device team

## 2019-06-30 NOTE — Progress Notes
Heart Failure Clinic Pharmacist Note    Prior authorization for Entresto 24/26 mg approved from 05/31/19-06/29/21. Notified clinic RN, CMontgomery, of approval to discuss medication changes with patient.    Doylene Canning, [place]

## 2019-06-30 NOTE — Telephone Encounter
From: Cherylann Banas, MD   Sent: 06/30/2019  1:27 PM CDT   To: Mendel Ryder, RN   Subject: RE: [employer]                      Would like to swap cozaar to low dose Entresto. Any reason why we can't? Check with pharmacy.     Would do that with reduced lasix to PRN only and see me in clinic.   -----------------------------------------------------------------------------------------------------------    Doylene Canning, PHARMD  Angelyn Punt, RN; Shearon Stalls, PHARMD   Caller: Unspecified (Today, 12:27 PM)            Hi! I tried to process RX but it required prior auth, I already got the PA approved - so there should not be any reason why we couldn't switch! I discontinued RX until someone talks to them, but we can have it shipped from [place] this evening.      Called and spoke at length with patient and his spouse about Entresto switch. Pt and spouse okay with medication change. Reviewed when to take medication, side effects to watch for and importance of monitoring BP/HR/weights daily. Discussed switching lasix to PRN dosing. Reviewed parameters to take dose.   Pt to get labs drawn in one week.     7/8-Pt to get labs at [facility] on [address]-will fax order  7/9-confirmed echo with patient/spouse  7/10-confirmed appointment with Dr. Kinnie Scales.     Pt okay with sending Entresto dose to [place] to get shipped out tonight,they might switch prescription to the [place] with Dr. Kinnie Scales but they will let us know. For now, I explained prior auth went through [place] and we received approval so they were okay with [place] mailing out prescription.     No additional questions at this time. Dr. Nat Christen RN updated.

## 2019-06-30 NOTE — Telephone Encounter
Call placed to pt for update on current weights and symptoms d/t device interrogation alert below. Spoke to wife. (Pt is hearing impaired) Wife reports pt's scales are needing batteries. Pt also was balance impairment. He has not been able to weigh. Weights in O2 are 183-186.0. No SOA at rest or DOE. No BLE edema at this time. He sometimes has some swelling to R foot. Wife reports B/Ps have been between 105/59 HR 63 to 140/73 HR 68. Pt added that his respiration are 24 bpms all the time.     Wife reports pt is compliant with all cardiac meds including lasix 20 mg qd & spiro 25 mg qd and potassium 20 mEq bid.    Confirmed echo at [facility] 07/09/19 and OV 07/10/19 with Dr. Kinnie Scales. Wife wanted to know if OV with Dr. Kinnie Scales can be changed to telehealth visit d/t their concern for COVID.     Informed pt we will forward this update to Dr. Kinnie Scales with device report and will cb with recommendations and if next OV can be telehealth visit.

## 2019-07-01 ENCOUNTER — Encounter: Admit: 2019-07-01 | Discharge: 2019-07-01

## 2019-07-02 ENCOUNTER — Encounter: Admit: 2019-07-02 | Discharge: 2019-07-02

## 2019-07-02 MED ORDER — POTASSIUM CHLORIDE 20 MEQ PO PACK
20 meq | Freq: Two times a day (BID) | ORAL | 3 refills | 30.00000 days | Status: AC
Start: 2019-07-02 — End: ?

## 2019-07-02 NOTE — Telephone Encounter
Pts spouse called asking about melatonin. Called back and informed this can be taken PRN. Spouse agreeable.

## 2019-07-06 ENCOUNTER — Encounter: Admit: 2019-07-06 | Discharge: 2019-07-06

## 2019-07-06 NOTE — Telephone Encounter
Spouse wanted to confirm lab order had potassium on it. Called and informed potassium would be tested. Lab order faxed

## 2019-07-08 LAB — BASIC METABOLIC PANEL
Lab: 1.6
Lab: 106
Lab: 14
Lab: 140
Lab: 5.2

## 2019-07-09 ENCOUNTER — Ambulatory Visit: Admit: 2019-07-09 | Discharge: 2019-07-09

## 2019-07-09 ENCOUNTER — Encounter: Admit: 2019-07-09 | Discharge: 2019-07-09

## 2019-07-09 DIAGNOSIS — I513 Intracardiac thrombosis, not elsewhere classified: Secondary | ICD-10-CM

## 2019-07-09 DIAGNOSIS — I5020 Unspecified systolic (congestive) heart failure: Secondary | ICD-10-CM

## 2019-07-09 MED ORDER — PERFLUTREN LIPID MICROSPHERES 1.1 MG/ML IV SUSP
1-20 mL | Freq: Once | INTRAVENOUS | 0 refills | Status: CP | PRN
Start: 2019-07-09 — End: ?
  Administered 2019-07-09: 21:00:00 5 mL via INTRAVENOUS

## 2019-07-10 ENCOUNTER — Encounter: Admit: 2019-07-10 | Discharge: 2019-07-10

## 2019-07-10 DIAGNOSIS — I2510 Atherosclerotic heart disease of native coronary artery without angina pectoris: Secondary | ICD-10-CM

## 2019-07-10 DIAGNOSIS — D649 Anemia, unspecified: Secondary | ICD-10-CM

## 2019-07-10 DIAGNOSIS — M1990 Unspecified osteoarthritis, unspecified site: Secondary | ICD-10-CM

## 2019-07-10 DIAGNOSIS — I509 Heart failure, unspecified: Secondary | ICD-10-CM

## 2019-07-10 DIAGNOSIS — E119 Type 2 diabetes mellitus without complications: Secondary | ICD-10-CM

## 2019-07-10 DIAGNOSIS — J189 Pneumonia, unspecified organism: Secondary | ICD-10-CM

## 2019-07-10 DIAGNOSIS — M549 Dorsalgia, unspecified: Secondary | ICD-10-CM

## 2019-07-10 DIAGNOSIS — Z9861 Coronary angioplasty status: Secondary | ICD-10-CM

## 2019-07-10 DIAGNOSIS — F319 Bipolar disorder, unspecified: Secondary | ICD-10-CM

## 2019-07-10 DIAGNOSIS — E785 Hyperlipidemia, unspecified: Secondary | ICD-10-CM

## 2019-07-10 DIAGNOSIS — N401 Enlarged prostate with lower urinary tract symptoms: Secondary | ICD-10-CM

## 2019-07-10 DIAGNOSIS — K635 Polyp of colon: Secondary | ICD-10-CM

## 2019-07-10 DIAGNOSIS — N400 Enlarged prostate without lower urinary tract symptoms: Secondary | ICD-10-CM

## 2019-07-10 DIAGNOSIS — Q540 Hypospadias, balanic: Secondary | ICD-10-CM

## 2019-07-10 DIAGNOSIS — I472 Ventricular tachycardia: Secondary | ICD-10-CM

## 2019-07-10 DIAGNOSIS — I10 Essential (primary) hypertension: Secondary | ICD-10-CM

## 2019-07-10 DIAGNOSIS — D696 Thrombocytopenia, unspecified: Secondary | ICD-10-CM

## 2019-07-10 DIAGNOSIS — F209 Schizophrenia, unspecified: Secondary | ICD-10-CM

## 2019-07-10 DIAGNOSIS — E669 Obesity, unspecified: Secondary | ICD-10-CM

## 2019-07-10 DIAGNOSIS — R161 Splenomegaly, not elsewhere classified: Secondary | ICD-10-CM

## 2019-07-13 ENCOUNTER — Encounter: Admit: 2019-07-13 | Discharge: 2019-07-13

## 2019-07-13 ENCOUNTER — Ambulatory Visit: Admit: 2019-07-13 | Discharge: 2019-07-13

## 2019-07-13 DIAGNOSIS — I472 Ventricular tachycardia: Secondary | ICD-10-CM

## 2019-07-13 DIAGNOSIS — D649 Anemia, unspecified: Secondary | ICD-10-CM

## 2019-07-13 DIAGNOSIS — F209 Schizophrenia, unspecified: Secondary | ICD-10-CM

## 2019-07-13 DIAGNOSIS — Z9861 Coronary angioplasty status: Secondary | ICD-10-CM

## 2019-07-13 DIAGNOSIS — M1990 Unspecified osteoarthritis, unspecified site: Secondary | ICD-10-CM

## 2019-07-13 DIAGNOSIS — K635 Polyp of colon: Secondary | ICD-10-CM

## 2019-07-13 DIAGNOSIS — R161 Splenomegaly, not elsewhere classified: Secondary | ICD-10-CM

## 2019-07-13 DIAGNOSIS — I10 Essential (primary) hypertension: Secondary | ICD-10-CM

## 2019-07-13 DIAGNOSIS — E119 Type 2 diabetes mellitus without complications: Secondary | ICD-10-CM

## 2019-07-13 DIAGNOSIS — M549 Dorsalgia, unspecified: Secondary | ICD-10-CM

## 2019-07-13 DIAGNOSIS — J189 Pneumonia, unspecified organism: Secondary | ICD-10-CM

## 2019-07-13 DIAGNOSIS — D696 Thrombocytopenia, unspecified: Secondary | ICD-10-CM

## 2019-07-13 DIAGNOSIS — I2510 Atherosclerotic heart disease of native coronary artery without angina pectoris: Secondary | ICD-10-CM

## 2019-07-13 DIAGNOSIS — E785 Hyperlipidemia, unspecified: Secondary | ICD-10-CM

## 2019-07-13 DIAGNOSIS — I5023 Acute on chronic systolic (congestive) heart failure: Secondary | ICD-10-CM

## 2019-07-13 DIAGNOSIS — N400 Enlarged prostate without lower urinary tract symptoms: Secondary | ICD-10-CM

## 2019-07-13 DIAGNOSIS — N401 Enlarged prostate with lower urinary tract symptoms: Secondary | ICD-10-CM

## 2019-07-13 DIAGNOSIS — Q540 Hypospadias, balanic: Secondary | ICD-10-CM

## 2019-07-13 DIAGNOSIS — I509 Heart failure, unspecified: Secondary | ICD-10-CM

## 2019-07-13 DIAGNOSIS — E669 Obesity, unspecified: Secondary | ICD-10-CM

## 2019-07-13 DIAGNOSIS — I5042 Chronic combined systolic (congestive) and diastolic (congestive) heart failure: Principal | ICD-10-CM

## 2019-07-13 DIAGNOSIS — I255 Ischemic cardiomyopathy: Secondary | ICD-10-CM

## 2019-07-13 DIAGNOSIS — F319 Bipolar disorder, unspecified: Secondary | ICD-10-CM

## 2019-07-13 NOTE — Progress Notes
Date of Service: 07/13/2019    Glenn Montgomery is a 85 y.o. adult.       HPI       We did video visit today with Zoom.     He was seen for routine follow up via telehealth with his wife. His past medical history is significant for ischemic cardiomyopathy, heart failure with reduced ejection fraction, coronary artery disease with known to have a CTO of his LAD and ventricular aneurysm, ventricular tachycardia s/p dual ICD, hypertension, hyperlipidemia, diabetes mellitus, chronic kidney disease, thrombocytopenia, schizophrenia with bipolar, and restless leg syndrome. His EF is 15-20% on echo.   ???  At that visit he was exhibiting NYHA III symptoms but difficult to assess with limited activity.   ???  He denies orthopnea, paroxysmal nocturnal dyspnea, early satiety, abdominal distention, chest pain, irregular heartbeat/palpitations, dizziness, or syncope/near syncope.???   ???  In screening him for anticoagulation: he has had no recent surgeries, no falls, no blood in stools, no hemoptysis, and no documented strokes.  ???  In screening for infection, wife denied any recent illness or fevers.         Vitals:    07/13/19 1343   BP: 124/61   BP Source: Arm, Left Upper   Pulse: 64   Temp: 36.4 ???C (97.5 ???F)   Weight: 81.6 kg (180 lb)   Height: 1.676 m (5' 6)   PainSc: Zero     Body mass index is 29.05 kg/m???.     Past Medical History  Patient Active Problem List    Diagnosis Date Noted   ??? Ischemic cardiomyopathy 06/18/2019   ??? Chronic kidney disease (CKD), stage III (moderate) (HCC) 06/18/2019   ??? AKI (acute kidney injury) (HCC) 06/18/2019   ??? OA (osteoarthritis) 02/23/2019   ??? HTN (hypertension) 02/23/2019   ??? HLD (hyperlipidemia) 02/23/2019   ??? Bipolar disorder (HCC) 02/23/2019   ??? Coronary artery disease involving native coronary artery of native heart 02/23/2019     01/24/2015 - Cardiac Catheterization:  [facility])  100% CTO of the LAD with right to left collaterals.  Aneurysmal anteroapical wall with EF = 20%. No MVR.  No AVS.  Normal LVEDP.     ??? DM (diabetes mellitus) (HCC) 02/23/2019   ??? CHF (congestive heart failure) (HCC) 02/23/2019   ??? VT (ventricular tachycardia) (HCC) 02/23/2019   ??? Obesity (BMI 30-39.9)    ??? 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 Hematuria 07/02/2016   ??? History of urethral stricture, bulbar      Urethral stricture dilation -- June 2017.     ??? BPH with obstruction/lower urinary tract symptoms      (+)ho prostate procedure ~ 1990's, possible TUMT, per pt's wife's description.    10/01/18: evaluation Gershon Cull, PA-C     ??? Renal cysts, acquired, bilateral 06/24/2015     Renal/ Bladder US (06/15/2015): incidental (B) renal cysts.  (L) hypoechoic cystic lesion 3.2 cm, 1.8 cm complex cyst.  (R) 2.5 cm cyst with increased echogenicity, 3.2 cm mass with internal echoes, mild peripheral vascularity.    03/25/18: CT without significant change    10/01/18: CT Abd/Pel with and without contrast 10/01/18 ~No significant change in bilateral renal carcinomas, see report, referred to review with PMD and Cardiology cardiac findings     ??? Thrombocytopenia, unspecified (HCC) 07/16/2013         Review of Systems   Constitution: Negative.   HENT: Negative.    Eyes: Negative.    Cardiovascular: Negative.  Respiratory: Negative.    Endocrine: Negative.    Hematologic/Lymphatic: Negative.    Skin: Negative.    Musculoskeletal: Negative.    Gastrointestinal: Negative.    Genitourinary: Negative.    Neurological: Negative.    Psychiatric/Behavioral: Negative.    Allergic/Immunologic: Negative.    All other systems reviewed and are negative.      Physical Exam    He is in no distress on the video monitor.     Cardiovascular Studies      Problems Addressed Today  Encounter Diagnoses   Name Primary?   ??? Chronic combined systolic and diastolic congestive heart failure (HCC) Yes   ??? VT (ventricular tachycardia) (HCC)    ??? Hypertension, unspecified type    ??? Ischemic cardiomyopathy        Assessment and Plan HFrEF with ischemic heart disease, CKD, VT. I reviewed his echo. EF remains low at 15%. He is on low doses of GDMT, recently transitioned to [place]; tolerating mostly. Limited by potassium and trying to avoid COVID19 with social distancing.     He is stable today. Unable to increase medications at this time.     Total time 25 minutes. Estimated counseling time 15 minutes regarding risks/benefits/alternatives.           Current Medications (including today's revisions)  ??? acetaminophen (TYLENOL EXTRA STRENGTH) 500 mg tablet Take 500 mg by mouth twice daily as needed for Pain.   ??? amiodarone (CORDARONE) 200 mg tablet Take one tablet by mouth daily. (Patient taking differently: Take 100 mg by mouth daily.)   ??? apixaban (ELIQUIS) 5 mg tablet Take one tablet by mouth twice daily.   ??? aspirin 81 mg chewable tablet Take 81 mg by mouth daily.   ??? Carboxymethylcellulose Sodium 0.5 % drop Place 2 drops into or around eye(s) four times daily as needed (both eyes).   ??? cholecalciferol (VITAMIN D-3) 400 unit tab tablet Take 400 Units by mouth daily.   ??? dextran 70/hypromellose (NATURAL BALANCE TEARS) 0.1/0.3 % ophthalmic solution Place 1 Drop into or around eye(s) as Needed.   ??? docusate (COLACE) 100 mg capsule Take 200 mg by mouth daily as needed for Constipation.   ??? fish oil /omega-3 fatty acids (SEA-OMEGA) 340/1000 mg capsule Take 1 capsule by mouth.   ??? fluticasone (FLONASE) 50 mcg/actuation nasal spray Apply 2 Sprays to each nostril as directed daily. Shake bottle gently before using.   ??? furosemide (LASIX) 20 mg tablet Take one tablet by mouth as Needed. Take if you have 3lb weight gain overnight, or 5lbs in one week, any swelling or shortness of breath.   ??? melatonin(+) 5 mg TbDL rapid dissolve tablet Dissolve 6 mg by mouth as Needed.   ??? metoprolol XL (TOPROL XL) 100 mg extended release tablet Take one-half tablet by mouth daily.   ??? mirtazapine (REMERON) 7.5 mg tablet Take 7.5 mg by mouth at bedtime daily. ??? multivit-min-FA-lycopen-lutein (CENTRUM SILVER ULTRA MEN'S) 300-600-300 mcg tab Take 1 Tab by mouth daily.   ??? Omega-3 Fatty Acids-Vitamin E (FISH OIL) 1,000 mg cap Take 1 Cap by mouth twice daily.   ??? other medication Take 1 Dose by mouth as Needed. Medication Name & Strength:thicket  Dose(how many): powder    Frequency(how often): as needed with liquids   Indications: needs to have thickner in liquids to help with dysphagia   ??? potassium chloride (KLOR-CON) 20 mEq packet Take one each by mouth twice daily.   ??? risperiDONE (RISPERDAL) 2 mg tablet Take  2 mg by mouth daily.   ??? sacubitriL-valsartan (ENTRESTO) 24-26 mg tablet Take one tablet by mouth twice daily.   ??? spironolactone (ALDACTONE) 25 mg tablet Take one tablet by mouth daily. Take with food.

## 2019-08-04 ENCOUNTER — Encounter: Admit: 2019-08-04 | Discharge: 2019-08-04

## 2019-08-04 DIAGNOSIS — Z95810 Presence of automatic (implantable) cardiac defibrillator: Secondary | ICD-10-CM

## 2019-08-05 ENCOUNTER — Ambulatory Visit: Admit: 2019-08-04 | Discharge: 2019-08-05

## 2019-08-05 ENCOUNTER — Encounter: Admit: 2019-08-05 | Discharge: 2019-08-05

## 2019-08-05 DIAGNOSIS — I255 Ischemic cardiomyopathy: Secondary | ICD-10-CM

## 2019-08-05 DIAGNOSIS — Z95810 Presence of automatic (implantable) cardiac defibrillator: Secondary | ICD-10-CM

## 2019-08-05 DIAGNOSIS — I5042 Chronic combined systolic (congestive) and diastolic (congestive) heart failure: Secondary | ICD-10-CM

## 2019-09-04 ENCOUNTER — Encounter: Admit: 2019-09-04 | Discharge: 2019-09-04

## 2019-09-04 MED ORDER — ENTRESTO 24-26 MG PO TAB
1 | ORAL_TABLET | Freq: Two times a day (BID) | ORAL | 5 refills | Status: AC
Start: 2019-09-04 — End: ?

## 2019-09-29 NOTE — Telephone Encounter
Second call placed to pt for update on current weights and symptoms. VM full. Call placed to pt's wife's number and this number is no longer a working number.

## 2019-10-06 ENCOUNTER — Encounter: Admit: 2019-10-06 | Discharge: 2019-10-06 | Payer: MEDICARE

## 2019-10-06 NOTE — Telephone Encounter
successful ATP on 10/04/19     Received: Today   Message Contents   Royann Shivers, Gasper Sells, RN  P Cvm Nurse [facility]              remote alert for successful ATP 10/4. 1:1 conduction, no onset seen. V rate 130 bpm      Attempted to call pt at preferred number VM full,  Attempted to call pt wife at number listed, phone number no longer in service  Sent My Chart message with nurse triage line to please call back.

## 2019-10-06 NOTE — Telephone Encounter
Mendel Ryder, RN  P Cvm Nurse [facility]           Previous Messages     ----- Message -----   From: Cherylann Banas, MD   Sent: 10/06/2019 11:49 AM CDT   To: Mendel Ryder, RN   Subject: RE: successful ATP on 10/04/19             APP visit and EP consult thanks   ----- Message -----   From: Mendel Ryder, RN   Sent: 10/06/2019 11:34 AM CDT   To: Cherylann Banas, MD   Subject: FW: successful ATP on 10/04/19             Dr.Sauer,     Pt received successful ATP on 10/4 V rate of 130 bpm- attempted to contact pt this morning x 2 and sent [facility] message. Please see below and full report in pt chart. Will continue to attempt to contact.     Unscheduled Remote   Reason: successful ATP x1   Alerts or Events: 18   Battery: Battery is at 16%   Sensing, impedance and thresholds reviewed   Programmed parameters reviewed   Presenting rhythm reviewed - ASVS 74 bpm   Heart Rate Histograms reviewed   No significant changes noted   Appropriate VT Therapy: Successful     *EGM: 10/04/19 @ 11:04am, lasting 56 seconds. no onset seen. 1:1   conduction, V rate 130 bpm. successful ATP x1   Number of ATP therapy: 1     Next OV is a telehealth visit with you on 10/20     Recommendations?     Duyen Beckom   ----- Message -----   From: Royann Shivers, Gasper Sells, RN   Sent: 10/06/2019 10:19 AM CDT   To: Cvm Nurse [facility]   Subject: successful ATP on 10/04/19               remote alert for successful ATP 10/4. 1:1 conduction, no onset seen. V rate 130 bpm              Message sent to EP , attempted to contact patient again for update

## 2019-10-13 ENCOUNTER — Encounter: Admit: 2019-10-13 | Discharge: 2019-10-13 | Payer: MEDICARE

## 2019-10-13 NOTE — Telephone Encounter
Call transferred to triage phone by [place] at [phone]. Pt's son reporting pt's ICD has shocked him X 3 within the last 30 minutes. No CP, or SOA. Son informed pt and wife are now living with them in [place], [place] d/t needing 24/7 care. Pt was able to provide verbal authorization for CVM to speak with son on his behalf regarding his health care. Requested son contact us once pt is released from hospital so we can request records.     Reviewed with Mendel Ryder, RN at [phone].  Advised son to take pt to nearest ED for evaluation. Son will have pt transported by EMT d/t mobility issues to [facility]. Son added to contact list as primary contact.

## 2019-10-15 ENCOUNTER — Encounter: Admit: 2019-10-15 | Discharge: 2019-10-15 | Payer: MEDICARE

## 2019-10-15 NOTE — Telephone Encounter
-----   Message from Louis Meckel, LPN sent at [id number]/45/8592  4:28 PM CDT -----  Regarding: MPE- shocked  VM on triage line from Gwynne Edinger, APRN at [facility] at [place].  Said that he was shocked by his ICD for SVT and was transferred from [facility] to [place] due to [place] had no beds.  Said that his Zone 2 rate was set at 130bpm and they increased it to 150bpm and increased his Toprol to 100mg  BID.  She wanted MPE nurse to know.

## 2019-10-15 NOTE — Telephone Encounter
CB and spoke with Janace Hoard, APRN at [facility] who reports info as noted in VM below.  She also reports that plan is for pt to be DC home at approximately 1800 tonight. Angie, APRN states that device change forms will be uploaded to media tab for MPE review.     Angie, APRN reports pt has limited drive for PT/OT and rehab.  She reports pt is currently living at home with Son, and they have 24 hr home health.  Angie, APRN states pt is stable from a HF and EP standpoint for DC home.    Plan to review with MPE for further recommendations and follow up.

## 2019-10-20 ENCOUNTER — Ambulatory Visit: Admit: 2019-10-20 | Discharge: 2019-10-21 | Payer: MEDICARE

## 2019-10-20 ENCOUNTER — Encounter: Admit: 2019-10-20 | Discharge: 2019-10-20 | Payer: MEDICARE

## 2019-10-20 DIAGNOSIS — E119 Type 2 diabetes mellitus without complications: Secondary | ICD-10-CM

## 2019-10-20 DIAGNOSIS — I509 Heart failure, unspecified: Secondary | ICD-10-CM

## 2019-10-20 DIAGNOSIS — N401 Enlarged prostate with lower urinary tract symptoms: Secondary | ICD-10-CM

## 2019-10-20 DIAGNOSIS — D696 Thrombocytopenia, unspecified: Secondary | ICD-10-CM

## 2019-10-20 DIAGNOSIS — D649 Anemia, unspecified: Secondary | ICD-10-CM

## 2019-10-20 DIAGNOSIS — N183 Stage 3 chronic kidney disease, unspecified whether stage 3a or 3b CKD: Secondary | ICD-10-CM

## 2019-10-20 DIAGNOSIS — M1990 Unspecified osteoarthritis, unspecified site: Secondary | ICD-10-CM

## 2019-10-20 DIAGNOSIS — K635 Polyp of colon: Secondary | ICD-10-CM

## 2019-10-20 DIAGNOSIS — I472 Ventricular tachycardia: Secondary | ICD-10-CM

## 2019-10-20 DIAGNOSIS — R161 Splenomegaly, not elsewhere classified: Secondary | ICD-10-CM

## 2019-10-20 DIAGNOSIS — M549 Dorsalgia, unspecified: Secondary | ICD-10-CM

## 2019-10-20 DIAGNOSIS — E785 Hyperlipidemia, unspecified: Secondary | ICD-10-CM

## 2019-10-20 DIAGNOSIS — I255 Ischemic cardiomyopathy: Secondary | ICD-10-CM

## 2019-10-20 DIAGNOSIS — F319 Bipolar disorder, unspecified: Secondary | ICD-10-CM

## 2019-10-20 DIAGNOSIS — Z9861 Coronary angioplasty status: Secondary | ICD-10-CM

## 2019-10-20 DIAGNOSIS — J189 Pneumonia, unspecified organism: Secondary | ICD-10-CM

## 2019-10-20 DIAGNOSIS — I10 Essential (primary) hypertension: Secondary | ICD-10-CM

## 2019-10-20 DIAGNOSIS — Q540 Hypospadias, balanic: Secondary | ICD-10-CM

## 2019-10-20 DIAGNOSIS — I2510 Atherosclerotic heart disease of native coronary artery without angina pectoris: Secondary | ICD-10-CM

## 2019-10-20 DIAGNOSIS — F209 Schizophrenia, unspecified: Secondary | ICD-10-CM

## 2019-10-20 DIAGNOSIS — E669 Obesity, unspecified: Secondary | ICD-10-CM

## 2019-10-20 DIAGNOSIS — N400 Enlarged prostate without lower urinary tract symptoms: Secondary | ICD-10-CM

## 2019-10-20 NOTE — Progress Notes
Date of Service: 10/20/2019    Obtained patient's verbal consent to treat them and their agreement to [facility] financial policy and NPP via this telehealth visit during the [facility] Emergency.    This video home visit was conducted via communication between the patient and physician/provider due to COVID-19.  We have found that certain health care needs can be provided without an in-person visit.. This service lets [place] provide the care patients' need.  If a prescription is necessary we can send it directly to their pharmacy.  If testing is necessary this can be arranged.    Glenn Montgomery is a 85 y.o. adult.     HPI     We did video visit today with Zoom.   ?  He was seen for routine follow up via telehealth with his wife.?His past medical history is significant for ischemic cardiomyopathy, heart failure with reduced ejection fraction, coronary artery disease?with?known to have a CTO of his LAD and ventricular aneurysm, ventricular tachycardia s/p dual ICD, hypertension, hyperlipidemia, diabetes mellitus, chronic kidney disease,?thrombocytopenia,?schizophrenia with bipolar,?and restless leg syndrome. His EF is 15-20% on echo.   ?  At that visit he was exhibiting NYHA III symptoms but difficult to assess with limited activity.     He was recently in the hospital at [facility] I do not have details of what he was there for.  He feels like his weight is a little bit up today but otherwise feels fine.         Telehealth Patient Reported Vitals     Row Name 10/20/19 0756                BP:  108/59        BP Source:  Arm, Left Upper        BP Position:  Sitting        Pulse:  60        Weight:  84.8 kg (187 lb)        Height:  1.727 m (5' 8)        Pain Score:  Zero            Patient denied having a BP cuff to report blood pressure    There is no height or weight on file to calculate BMI.     Past Medical History  Patient Active Problem List    Diagnosis Date Noted ? Ischemic cardiomyopathy 06/18/2019   ? Chronic kidney disease (CKD), stage III (moderate) 06/18/2019   ? AKI (acute kidney injury) (HCC) 06/18/2019   ? OA (osteoarthritis) 02/23/2019   ? HTN (hypertension) 02/23/2019   ? HLD (hyperlipidemia) 02/23/2019   ? Bipolar disorder (HCC) 02/23/2019   ? Coronary artery disease involving native coronary artery of native heart 02/23/2019     01/24/2015 - Cardiac Catheterization:  [facility])  100% CTO of the LAD with right to left collaterals.  Aneurysmal anteroapical wall with EF = 20%.  No MVR.  No AVS.  Normal LVEDP.     ? DM (diabetes mellitus) (HCC) 02/23/2019   ? CHF (congestive heart failure) (HCC) 02/23/2019   ? VT (ventricular tachycardia) (HCC) 02/23/2019   ? Obesity (BMI 30-39.9)    ? 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 Hematuria 07/02/2016   ? History of urethral stricture, bulbar      Urethral stricture dilation -- June 2017.     ? BPH with obstruction/lower urinary tract symptoms      (+)ho prostate procedure ~  1990's, possible TUMT, per pt's wife's description.    10/01/18: evaluation Gershon Cull, PA-C     ? Renal cysts, acquired, bilateral 06/24/2015     Renal/ Bladder US (06/15/2015): incidental (B) renal cysts.  (L) hypoechoic cystic lesion 3.2 cm, 1.8 cm complex cyst.  (R) 2.5 cm cyst with increased echogenicity, 3.2 cm mass with internal echoes, mild peripheral vascularity.    03/25/18: CT without significant change    10/01/18: CT Abd/Pel with and without contrast 10/01/18 ~No significant change in bilateral renal carcinomas, see report, referred to review with PMD and Cardiology cardiac findings     ? Thrombocytopenia, unspecified (HCC) 07/16/2013         ROS    Physical exam limited d/t video tele health visit:  General Appearance: patient in no apparent distress.  Skin: pink, no jaundice, exposed skin is intact  Eyes: conjunctivae and lids normal, pupils are equal and round   Lips: no pallor or cyanosis Neck Veins: VESSELS:JVP difficult to appreciate    Respiratory Effort: breathing comfortably, no respiratory distress   Lower extremities: 1+ non pitting edema  Orientation: oriented to time, place and person   PSYCH: Appropriate   Language and Memory: patient responsive and seems to comprehend information   NEURO: Alert and conversant    Cardiovascular Studies      Problems Addressed Today  Encounter Diagnoses   Name Primary?   ? VT (ventricular tachycardia) (HCC) Yes   ? Ischemic cardiomyopathy    ? Stage 3 chronic kidney disease, unspecified whether stage 3a or 3b CKD        Assessment and Plan     HFrEF with ischemic heart disease, CKD, VT. I reviewed his echo. EF remains low at 15%. He is on low doses of GDMT, recently transitioned to [place]; tolerating mostly. Limited by potassium and trying to avoid COVID19 with social distancing.     He has some volume expansion but it is hard to tell over video conference.     We will arrange follow-up with first available HF APP at [facility] office in 1-2 weeks.     Total time 25 minutes. Estimated counseling time 15 minutes including risks/benefits/alternatives discussion related to plan as outlined and answering all questions related to the care plan while educating on the importance of adherence to recommended therapies, outpatient follow-up, and also addressing prognosis specific to the patient/family concerns.               Current Medications (including today's revisions)  ? acetaminophen (TYLENOL EXTRA STRENGTH) 500 mg tablet Take 500 mg by mouth twice daily as needed for Pain.   ? amiodarone (CORDARONE) 200 mg tablet Take one tablet by mouth daily. (Patient taking differently: Take 100 mg by mouth daily.)   ? apixaban (ELIQUIS) 5 mg tablet Take one tablet by mouth twice daily.   ? aspirin 81 mg chewable tablet Take 81 mg by mouth daily.   ? Carboxymethylcellulose Sodium 0.5 % drop Place 2 drops into or around eye(s) four times daily as needed (both eyes). ? cholecalciferol (VITAMIN D-3) 400 unit tab tablet Take 400 Units by mouth daily.   ? dextran 70/hypromellose (NATURAL BALANCE TEARS) 0.1/0.3 % ophthalmic solution Place 1 Drop into or around eye(s) as Needed.   ? docusate (COLACE) 100 mg capsule Take 200 mg by mouth daily as needed for Constipation.   ? fish oil /omega-3 fatty acids (SEA-OMEGA) 340/1000 mg capsule Take 1 capsule by mouth.   ? fluticasone (FLONASE)  50 mcg/actuation nasal spray Apply 2 Sprays to each nostril as directed daily. Shake bottle gently before using.   ? furosemide (LASIX) 20 mg tablet Take one tablet by mouth as Needed. Take if you have 3lb weight gain overnight, or 5lbs in one week, any swelling or shortness of breath.   ? melatonin(+) 5 mg TbDL rapid dissolve tablet Dissolve 6 mg by mouth as Needed.   ? metoprolol XL (TOPROL XL) 100 mg extended release tablet Take one-half tablet by mouth daily.   ? multivit-min-FA-lycopen-lutein (CENTRUM SILVER ULTRA MEN'S) 300-600-300 mcg tab Take 1 Tab by mouth daily.   ? Omega-3 Fatty Acids-Vitamin E (FISH OIL) 1,000 mg cap Take 1 Cap by mouth twice daily.   ? other medication Take 1 Dose by mouth as Needed. Medication Name & Strength:thicket  Dose(how many): powder    Frequency(how often): as needed with liquids   Indications: needs to have thickner in liquids to help with dysphagia   ? potassium chloride (KLOR-CON) 20 mEq packet Take one each by mouth twice daily.   ? risperiDONE (RISPERDAL) 2 mg tablet Take 2 mg by mouth daily.   ? sacubitriL-valsartan (ENTRESTO) 24-26 mg tablet Take one tablet by mouth twice daily.   ? spironolactone (ALDACTONE) 25 mg tablet Take one tablet by mouth daily. Take with food.

## 2019-10-20 NOTE — Patient Instructions
Thank you for coming to [facility]. Your instructions today:     1.  Recommendations:     No changes to medications today.     2.  Next follow up appointment in 1-2 weeks with next available HF NP at [facility] office.     For up to date information on the COVID-19 virus, visit the [facility] website.  ? General supportive care during cold and flu season and infection prevention reminders:   o Wash hands often with soap and water for at least 20 seconds  o Cover your mouth and nose  o Stay home if sick and symptoms mild or manageable  ? If you must be around people wear a mask    ? If you are having symptoms of a lower respiratory infection (cough, shortness of breath) and/or fever AND either traveled in last 30 days (internationally or to region of exposure) OR known exposure to patient with COVID19:    o Call your primary care provider for questions or health needs.   ? Tell your doctor about your recent travel and your symptoms    o In a medical emergency, call 911 or go to the nearest emergency room.    If you wish to contact us, please call and leave a message for the heart failure nurses at [phone].   To schedule or change an appointment call [phone].    Lamont Snowball, MD  [name], APRN  Justus Memory, APRN  Tora Kindred, RN  [facility]  Phone: [phone] Fax: [phone]    Your Heart Failure Symptom Awareness and Action Plan  Every Day Action Plan  ? Weigh yourself in the morning before breakfast. Write it down and compare it to yesterday's weight.  ? Take your medicine, as prescribed. Please call if you have concerns about the side effects, cost or refills.  ? Check for worsened swelling in your feet, ankles and stomach  ? Follow a 2000mg  salt diet.  ? Keep all healthcare appointments    Green Zone   Good! Symptoms are under control ? No shortness of breath  ? No increase in ankle swelling  ? No weight gain ? No chest pain  ? No change in your usual activity  ? Continue to follow your everyday action plan.    Yellow Zone  If you have any of these symptoms, please call the heart failure nurses: [phone] ? Increased shortness of breath with activity  ? Weight gain of 3 pounds in one day or 5 pounds in a week  ? Increased swelling in your ankles or legs  ? Increased swelling in your stomach  ? Increasing fatigue  ? You may need an adjustment of your medications.    Red Zone  These are urgent symptoms. Please call the heart failure nurses: [phone] ? Shortness of breath at rest or waking up at night feeling short of breath or coughing  ? Increased number of pillows used or needing to sit upright to sleep  ? Chest tightness at rest  ? Dizziness, lightheadedness or feeling faint You need to schedule an appointment   Emergency Zone ? call 911 ? Worsening chest tightness or pain that is not relieved by medication  ? Severe shortness of breath and a cough with pink, frothy sputum

## 2019-10-23 ENCOUNTER — Encounter: Admit: 2019-10-23 | Discharge: 2019-10-23 | Payer: MEDICARE

## 2019-10-23 NOTE — Telephone Encounter
-----   Message from Mercy Riding sent at 10/23/2019  8:32 AM CDT -----  Regarding: FW: MPE -- remote Merlin  Transmission not received yet. I called and spoke to a lady at [phone]. She was not at home with him so she said call back later. Call and have them send a transmission.   ----- Message -----  From: Mercy Riding  Sent: 10/22/2019  To: Cherlyn Labella Remote  Subject: FW: MPE -- remote Merlin                         Have we received a check yet. If not then call patient.   ----- Message -----  From: Idelle Crouch, RN  Sent: 10/21/2019   9:14 AM CDT  To: Mac Ep Remote  Subject: MPE -- remote                                    Pt should be sending in a remote today per MPE.  Please keep an eye out for this.  Looks like he was seen at [facility] on 10/13 for x3 device discharge.     Thanks!!    Will

## 2019-10-23 NOTE — Telephone Encounter
Left message on voicemail for patient to send a manual merlin transmission.  We have not received one as of 10/23/19 @ 1305.  Will forward flag to [place] EP remote pool for follow up if not received today.

## 2019-10-26 ENCOUNTER — Encounter: Admit: 2019-10-26 | Discharge: 2019-10-26 | Payer: MEDICARE

## 2019-10-26 NOTE — Telephone Encounter
LVM with son as directed requesting merlin transmission.     Left VM with Truman Hayward, instructions and included c/b number should he have questions.

## 2019-10-26 NOTE — Telephone Encounter
-----   Message from Tana Conch sent at 10/23/2019  4:32 PM CDT -----  Regarding: FW: MPE -- remote Merlin  LM on VM for patient to transmit.  No transmission received as o n 10/23/19 @ 1633.  Have we received transmission?    ----- Message -----  From: Mercy Riding  Sent: 10/23/2019   8:32 AM CDT  To: Mac Ep Remote  Subject: FW: MPE -- remote Merlin                         Transmission not received yet. I called and spoke to a lady at [phone]. She was not at home with him so she said call back later. Call and have them send a transmission.   ----- Message -----  From: Mercy Riding  Sent: 10/22/2019  To: Cherlyn Labella Remote  Subject: FW: MPE -- remote Merlin                         Have we received a check yet. If not then call patient.   ----- Message -----  From: Idelle Crouch, RN  Sent: 10/21/2019   9:14 AM CDT  To: Mac Ep Remote  Subject: MPE -- remote                                    Pt should be sending in a remote today per MPE.  Please keep an eye out for this.  Looks like he was seen at [facility] on 10/13 for x3 device discharge.     Thanks!!    Will

## 2019-10-27 ENCOUNTER — Encounter: Admit: 2019-10-27 | Discharge: 2019-10-27 | Payer: MEDICARE

## 2019-10-27 NOTE — Telephone Encounter
Glenn Montgomery returned my call x 2, both times there was a "garbled" message on recording  attempted to call him back and phone message asked for an extension.

## 2019-10-27 NOTE — Telephone Encounter
-----   Message from [facility] sent at 10/27/2019  8:51 AM CDT -----  Regarding: FW: Remote Rcv'd? - SJD  Elevated CorVue on 10/26/19 remote. Also, we did not have access to the shock egms due to device being checked at [facility].  ----- Message -----  From: Ayesha Rumpf, RN  Sent: 10/27/2019  To: Cherlyn Labella Remote  Subject: Remote Rcv'd? - Henreitta Cea left message on 10/26 for the patient to send transmission. Has it been received yet? If not, please FU. See message below. Thank you.   ----- Message -----  From: Mercy Riding  Sent: 10/23/2019   8:32 AM CDT  To: Mac Ep Remote  Subject: FW: MPE -- remote Merlin                         Transmission not received yet. I called and spoke to a lady at [phone]. She was not at home with him so she said call back later. Call and have them send a transmission.   ----- Message -----  From: Mercy Riding  Sent: 10/22/2019  To: Cherlyn Labella Remote  Subject: FW: MPE -- remote Merlin                         Have we received a check yet. If not then call patient.   ----- Message -----  From: Idelle Crouch, RN  Sent: 10/21/2019   9:14 AM CDT  To: Mac Ep Remote  Subject: MPE -- remote                                    Pt should be sending in a remote today per MPE.  Please keep an eye out for this.  Looks like he was seen at [place] on 10/13 for x3 device discharge.     Thanks!!    Will

## 2019-10-27 NOTE — Telephone Encounter
attempted to call today, however, had to leave a message. asked Don to c/b to discuss abnormal optivol readings.  will discuss f/u   per Dr Wynona Luna last office visit 10/20/19 - plan was for him to f/u with HF NP in 1 - 2 weeks.      needs f/u appt with HF APP ASAP      NOTE below from disharge summary at [facility]      CARDIOLOGY DISCHARGE SUMMARY       Patient: Glenn Montgomery Date of Birth: 07-Feb-1937   MRN: [id number] PCP: Pcp None   Code: Full Code Condition: Stable     Date of Admission: 10/13/2019 Date of Discharge: 10/15/19    DISCHARGE SUMMARY INFORMATION     Patients Primary Cardiologist: Dr. [name] [name] at [facility]    Discharge Diagnosis:   Principal Problem:  Inappropriate shocks from ICD (implantable cardioverter-defibrillator)  Active Problems:  Inappropriate shocks from ICD (implantable cardioverter-defibrillator), initial encounter    Consults (Specialty, Procedure):  Electrophysiology consult with Dr. Meredeth Ide    Procedures (Date, Procedure, Result):   None    EKG/Tele Results:   A.paced/SR at 60's    Cardiac Testing:  -Echocardiogram 10/14/19:  Severely reduced left ventricular systolic function, left ventricular ejection fraction 20-25%.   Apex, apical septal, apical lateral, apical inferior walls are aneurysmal.   Cannot rule out apical thrombus. Definitely contrast was used for better endocardial definition.   Small circumferential pericardial effusion without tamponade physiology.       Vital signs at discharge:  BP (!) 110/53 (BP Location: Left arm, Patient Position: Lying) - Pulse 61 - Temp 36.6 ?C (97.8 ?F) (Oral) - Resp 18 - Ht 1.727 m (5' 8) - Wt 84.9 kg (187 lb 3.2 oz) - SpO2 100% - BMI 28.46 kg/m?     Physical Exam:   GENERAL: Appears to be in no acute distress, he appears chronically debilitated   NEURO: Appears sleepy but arouses to voice and has appropriate responses  RESP: Lungs clear to auscultation, decreased breath sounds in bases CV: Heart sounds S1 S2 regular without murmur or gallop   lower extremities without edema, pulses are palpable     Brief Hospital Course:  [name] is an 85 y.o. old male presenting with ICD shock x3. He has a long history of coronary artery disease dating back to 1986 with an ischemic cardiomyopathy and recurrent ventricular tachycardia with AICD. He reports that he was in his usual state of health on 10/13/2019 when he got up to use the restroom and had severe pain in his legs and back and when he sat down his device shocked him 3 times. He had been on amiodarone therapy and Toprol 150 mg daily. When we interrogated his device he was found to have episodes of SVT with heart rates 130 to 140 bpm. He was initially treated with ATP but his SVT was in the VT 2 zone and was treated.    He has a known history of slow ventricular tachycardia which is why his heart rate was set at 130 bpm for the VT zone. We adjusted his rate to 150 bpm and increase his Toprol to 100 mg twice daily. He has had no recurrence of SVT but has become very and active since his hospitalization. PT and OT have both evaluated and report that he cannot do a sit to stand and is now below baseline. I have had multiple conversations with his son who understand that he is  failing and has 24-hour care set up at home along with PT, OT and caregivers.    He has no evidence of significant fluid overload he is on Lasix 40 mg daily. He will remain on Entresto, Toprol, spironolactone and amiodarone. He was seen by electrophysiology who agreed with the changes that were made. He is felt stable for discharge home. His son is planning to pick him up this evening and understands his level of debility.    I have contacted Dr. [name] office at [facility] with an update on the adjustments made in his defibrillator settings and gave them my cell phone number to call with questions. He will keep scheduled follow-up in the cardiology clinic at [facility]. Tobacco Use evaluation:   reports that he has quit smoking. His smoking use included cigarettes. He does not have any smokeless tobacco history on file.    Discharge Medications:    Your medication list     START taking these medications   Instructions Last Dose Given Next Dose Due   atorvastatin 20 mg tablet  Commonly known as: LIPITOR    Take 1 tablet (20 mg total) by mouth every night.      CHANGE how you take these medications   Instructions Last Dose Given Next Dose Due   apixaban 2.5 mg tablet  Commonly known as: ELIQUIS  What changed:   ? medication strength  ? how much to take    Take 1 tablet (2.5 mg total) by mouth 2 (two) times per day.    metoprolol succinate XL 100 mg 24 hr tablet  Commonly known as: TOPROL-XL  What changed:   ? how much to take  ? when to take this    Take 1 tablet (100 mg total) by mouth 2 (two) times per day.      CONTINUE taking these medications   Instructions Last Dose Given Next Dose Due   amiodarone 200 mg tablet  Commonly known as: PACERONE      Entresto 24-26 mg tablet  Generic drug: sacubitriL-valsartan      furosemide 20 mg tablet  Commonly known as: LASIX      melatonin tablet      mirtazapine 30 mg tablet  Commonly known as: REMERON      omega 3-dha-epa-fish oil 1,000 mg capsule      risperiDONE 3 mg tablet  Commonly known as: RisperDAL      Senna with Docusate Sodium 8.6-50 mg  Generic drug: sennosides-docusate sodium      spironolactone 25 mg tablet  Commonly known as: ALDACTONE      Tylenol Arthritis Pain 650 mg 8 hr tablet  Generic drug: acetaminophen          Where to Get Your Medications     These medications were sent to [id number] - [place] [place] - [address] [place], [place] [address]   Phone: [phone]   ? apixaban 2.5 mg tablet  ? atorvastatin 20 mg tablet  ? metoprolol succinate XL 100 mg 24 hr tablet      Total time spent on completing this discharge was 45 minutes. This includes but is not limited to face to face encounter with patient for review of hospital stay, education on disease process, discharge medications and plan for follow up care. Physical exam was completed. Also completed review of patient records and finalizing the discharge reconciliation in EMR including new orders and prescriptions.     Alinda Dooms Bachelor, APRN  10/15/2019, 4:06 PM    I have seen and examined the patient and reviewed their relevant clinical information.  Denies any chest pain or shortness of breath  Denies any palpitations or lightheadedness  Slept better yesterday    Telemetry-a paced  No arrhythmias noted    PHYSICAL EXAM:  General appearance -elderly male NAD comfortable  HEENT - Neck Supple, no JVD  Chest -BAE+ CTA   Heart - S1s2+ RRR  Abdomen -Soft NT ND  Neurological - No focal deficits  Extremities -No cyanosis, clubbing, edema  Skin - Intact    Assessment /Plan:   85 year old male with coronary artery disease prior MI, ischemic cardiomyopathy EF 15 to 20%, apical aneurysm, ventricular tachycardia, prior ICD, CHF presenting with ICD shocks  ?  1. ICD shock-inappropriate  St-Jude device was interrogated, he had episodes of SVT with heart rate 130-140 bpm, treated with ATP x3 prior to the 3 ICD shocks. SVT was in the VT 2 zone  ?  Continue Toprol-XL 100 mg bidaily, the dose was increased from 150 mg daily  -Continue amiodarone  -VT 2 zone was increased from 130 to 150 bpm  -[place] EP team was contacted with the above changes  ?  2. Ischemic cardiomyopathy EF 15 to 20%  On guideline directed medical therapy  Continue Entresto 24 x 26 mg twice daily, spironolactone 25 mg daily  Increased Toprol-XL as above  ?  3. CHF-chronic systolic  Continue Lasix 20 mg daily  Echo revealed EF 20-25% with possible apical thrombus  Continue Eliquis, at lower dose 2.5 mg twice daily due to chronic kidney disease  ?  4. Coronary artery disease-known CTO of LAD Nuclear stress test in June 2020 showed fixed defect no reversible ischemia and apical aneurysm  Continue beta-blocker    ?  5. Systemic hypertension  Med changes as above  ?  6. Hyperlipidemia  Initiate statin therapy  ?  7. Chronic thrombocytopenia  Plt Count 80-100 K  ?    Plan discussed with referring physician and nursing team.    Cardiology team spent total of 35 minutes reviewing relevant clinical data, examining patient, and discussing care coordination as well as discharge planning.     Waynard Reeds, MD  ?   Electronically signed by Waynard Reeds, MD at 10/15/2019 5:18 PM CDT?     Discharge Instructions  - documented in this encounter  Table of Contents for Discharge Instructions   Instructions   Attachments      Instructions  Jerilynn Som, RN - 10/15/2019?   Follow up with [name] and Dr. [name] at [facility] cardiology as scheduled, we have notified them of your defibrillator setting changes.    Notify your Cardiology of weight gain greater than 2 lbs in one day.    Diet: Cardiac (low sodium diet) with Honey Thick Liquids.    ?

## 2019-10-27 NOTE — Progress Notes
URGENT, Need ASAP     Request for the following medical records, for the purpose Continuity of Care.     Please send the following:     ? EKG from 10/13/2019 (ED Visit)   ? Device Interrogations from 10/13/2019    Please Fax to:   FAX#: [phone]  Attn: Judeen Hammans

## 2019-10-27 NOTE — Progress Notes
URGENT, Please send ASAP     Request for the following medical records, for the purpose Continuity of Care.     Please send the following:     ? Device Interrogation Report from 10/13/19    Please Fax to:   FAX#: [phone]  Attn: Judeen Hammans

## 2019-10-28 ENCOUNTER — Encounter: Admit: 2019-10-28 | Discharge: 2019-10-28 | Payer: MEDICARE

## 2019-10-28 NOTE — Progress Notes
URGENT, Please Send ASAP     Request for the following medical records, for the purpose Continuity of Care.     Please send the following:     ? Recent Device Interrogation from ED Visit  (with strips)   ?     Please Fax to:   FAX#: [phone]  Attn: Judeen Hammans

## 2019-10-29 ENCOUNTER — Encounter: Admit: 2019-10-29 | Discharge: 2019-10-29 | Payer: MEDICARE

## 2019-10-29 NOTE — Telephone Encounter
-----   Message from Louis Meckel, LPN sent at 84/69/6295 12:21 PM CDT -----  Regarding: MPE- asked for Jocelyn Lamer in ED  VM from daughter-in-law Lerry Paterson [phone] on triage line.  Said that home health nurse came out to see him and his b/p 89/44 so she took him to [facility] ED and is there now.  She would like to talk to [place].

## 2019-10-29 NOTE — Telephone Encounter
returned call to Ms Janace Hoard, daughter in law.  [facility] is keeping Mr Schappert to, "run some tests" and see why his bp was so low this am.  She will stay in touch with our office.

## 2019-11-03 ENCOUNTER — Ambulatory Visit: Admit: 2019-11-03 | Discharge: 2019-11-03 | Payer: MEDICARE

## 2019-11-03 DIAGNOSIS — Z95810 Presence of automatic (implantable) cardiac defibrillator: Secondary | ICD-10-CM

## 2019-11-03 DIAGNOSIS — I255 Ischemic cardiomyopathy: Secondary | ICD-10-CM

## 2019-11-20 ENCOUNTER — Encounter: Admit: 2019-11-20 | Discharge: 2019-11-20 | Payer: MEDICARE

## 2019-11-20 NOTE — Telephone Encounter
-----   Message from Matilde Bash, RN sent at 11/20/2019  9:10 AM CST -----  Regarding: MPE-STach in VT zone?  Alert remote for VT event.  Having multiple (50+) episodes of what appears to be stach  in VT monitor zone which is set at 115bpm.  Why is he having tachycardia?  Changes his zones?  Has device check on ly next week in [place].  Pls check in with pt re: symptoms and MPE re possible changes to settings.  thx

## 2019-11-20 NOTE — Progress Notes
During an office visit with me in the EP section of the [place] cardiology office in February the patient had VT occurring below his detection rate of 405 ms.  We had increase his amiodarone and based on that and concern for potentially slowing his VT even further reprogrammed a VT monitor zone at 520 ms and a VT with therapy zone at 460 ms.    Since that time his amiodarone dose has been reduced back down to 200 mg daily.  A device evaluation documented there is having multiple recurrent episodes of "SVT" in his VT monitor zone.  Review of those shows 100% morphology match and shows a 1: 1 relationship with the AV.  Is normal certainly of course sinus tachycardia.    Based on the above and the fact that his amiodarone dose is back down to 200 mg daily, we will reprogram his device upon his next visit or upon presentation to the office.    We will set his VT lowest zone at 460 ms with ATP therapy, defibrillations, etc. on.  We will program a second VT zone in the 160 bpm range with ATP therapy on an defibrillations, etc. and a VF zone.

## 2019-11-20 NOTE — Telephone Encounter
I spoke with his power of attorney who states that he has been doing well.  I do not know why device settings are this way.   Will review with MPE.

## 2019-11-24 ENCOUNTER — Encounter: Admit: 2019-11-24 | Discharge: 2019-11-24 | Payer: MEDICARE

## 2019-11-24 ENCOUNTER — Ambulatory Visit: Admit: 2019-11-24 | Discharge: 2019-11-24 | Payer: MEDICARE

## 2019-11-24 DIAGNOSIS — I472 Ventricular tachycardia: Secondary | ICD-10-CM

## 2019-11-24 DIAGNOSIS — I10 Essential (primary) hypertension: Secondary | ICD-10-CM

## 2019-11-24 DIAGNOSIS — I2510 Atherosclerotic heart disease of native coronary artery without angina pectoris: Secondary | ICD-10-CM

## 2019-11-24 DIAGNOSIS — Z95810 Presence of automatic (implantable) cardiac defibrillator: Secondary | ICD-10-CM

## 2019-12-21 ENCOUNTER — Encounter: Admit: 2019-12-21 | Discharge: 2019-12-21 | Payer: MEDICARE

## 2019-12-21 MED ORDER — ATORVASTATIN 20 MG PO TAB
ORAL_TABLET | Freq: Every evening | 1 refills | Status: DC
Start: 2019-12-21 — End: 2020-01-12

## 2020-01-04 ENCOUNTER — Encounter: Admit: 2020-01-04 | Discharge: 2020-01-04 | Payer: MEDICARE

## 2020-01-04 NOTE — Telephone Encounter
Dr [name] recommended device changes ot Glenn Montgomery device as noted below    scheduled for Wednesday 1/6 at OP at 1230 pm  wife and Glenn Pfenning verbalized understanding    Dr [name] is in the office at OP that day if further questions

## 2020-01-04 NOTE — Telephone Encounter
DE 1   Glenn Montgomery  Male, 86 y.o., April 07, 1928  MRN:   [id number]  Phone:   [phone] Judie Petit)  PCP:   Grayce Sessions, DO  Primary Cvg:   Medicare/Medicare Part A And B  Next Appt  With Cardiology (Pacemaker CMPB3)  01/06/2020 at 12:30 PM  Episodes in VT monitor zone, not VT  Received: Yesterday  Message Contents   Maddux, Terri  P Cvm Nurse Ep            Pt alerted remotely for VT/VF episode. Upon review, episodes appear to be likely SVT in VT monitor zone which begins at 115 bpm. Therapy begins at 150 bpm. Pt had not had any episodes in therapy zone. There were some NSVT episodes noted that did not have a recorded EGM to view. Complete report in chart for further detail/review. Follow up as needed.      Thank you,   Terri/Device Team

## 2020-01-04 NOTE — Telephone Encounter
-----   Message from Smith Robert, MD sent at 01/03/2020  6:51 PM CST -----  Please have pt come in relatively soon, coordinated by device team.  See comments below.  Thank you  I want to reprogram his device.  ?  We will set his VT lowest zone at 460 ms with ATP therapy, defibrillations, etc. on.  We will program a second VT zone in the 160 bpm range with ATP therapy on an defibrillations, etc. and a VF zone.  ---------  On 01/01/2020 at 6:52 AM appearing she had a SVT event at 125 bpm or 40 ms.  There was a 98% morphology match.  The rhythm was clearly driven by the atrium.  There was 1:1 conduction.  Also 01/01/2020 at 12:54 PM patient had a VT event, that is an event in the VT 1 monitor only zone.  This is also clearly supraventricular in origin likely sinus tachycardia.  Does not appear to be VT.  There is a one-to-one relationship between the atrium and the ventricle.    ======  From a documentation encounter by me on 11/20/2019:    During an office visit with me in the EP section of the [place] cardiology office in February the patient had VT occurring below his detection rate of 405 ms.  We had increase his amiodarone and based on that and concern for potentially slowing his VT even further reprogrammed a VT monitor zone at 520 ms and a VT with therapy zone at 460 ms.  ?  Since that time his amiodarone dose has been reduced back down to 200 mg daily.  A device evaluation documented there is having multiple recurrent episodes of SVT in his VT monitor zone.  Review of those shows 100% morphology match and shows a 1: 1 relationship with the A-V.  Is normal certainly of course sinus tachycardia.  ?  Based on the above and the fact that his amiodarone dose is back down to 200 mg daily, we will reprogram his device upon his next visit or upon presentation to the office.  ? We will set his VT lowest zone at 460 ms with ATP therapy, defibrillations, etc. on.  We will program a second VT zone in the 160 bpm range with ATP therapy on an defibrillations, etc. and a VF zone.

## 2020-01-12 ENCOUNTER — Encounter: Admit: 2020-01-12 | Discharge: 2020-01-12 | Payer: MEDICARE

## 2020-01-12 MED ORDER — ATORVASTATIN 20 MG PO TAB
ORAL_TABLET | Freq: Every evening | 3 refills | Status: DC
Start: 2020-01-12 — End: 2020-04-08

## 2020-02-02 ENCOUNTER — Encounter: Admit: 2020-02-02 | Discharge: 2020-02-02 | Payer: MEDICARE

## 2020-02-02 ENCOUNTER — Ambulatory Visit: Admit: 2020-02-02 | Discharge: 2020-02-02 | Payer: MEDICARE

## 2020-02-02 DIAGNOSIS — I255 Ischemic cardiomyopathy: Secondary | ICD-10-CM

## 2020-02-02 DIAGNOSIS — Z95810 Presence of automatic (implantable) cardiac defibrillator: Secondary | ICD-10-CM

## 2020-02-02 NOTE — Telephone Encounter
-----   Message from Griffith Citron, [place] sent at 02/02/2020  5:02 PM CST -----  Regarding: ongoing fl overload  2/2 remote showing possible fl overload/HF ongoing for ~5-6days at the time of the transmission.    Please f/u as needed.    Thanks,  Kaylee/device team

## 2020-02-02 NOTE — Telephone Encounter
Left message for patient to call back. Once patient calls back, need to assess s/s of fl overload/HF.

## 2020-02-18 ENCOUNTER — Encounter: Admit: 2020-02-18 | Discharge: 2020-02-18 | Payer: MEDICARE

## 2020-02-19 MED ORDER — AMIODARONE 200 MG PO TAB
200 mg | ORAL_TABLET | Freq: Every day | ORAL | 0 refills | 42.00000 days | Status: AC
Start: 2020-02-19 — End: ?

## 2020-04-08 ENCOUNTER — Encounter: Admit: 2020-04-08 | Discharge: 2020-04-08 | Payer: MEDICARE

## 2020-04-08 MED ORDER — ATORVASTATIN 20 MG PO TAB
ORAL_TABLET | Freq: Every evening | 1 refills | Status: AC
Start: 2020-04-08 — End: ?

## 2020-04-15 ENCOUNTER — Encounter: Admit: 2020-04-15 | Discharge: 2020-04-15 | Payer: MEDICARE

## 2020-05-04 ENCOUNTER — Encounter: Admit: 2020-05-04 | Discharge: 2020-05-04 | Payer: MEDICARE

## 2020-05-10 ENCOUNTER — Encounter: Admit: 2020-05-10 | Discharge: 2020-05-10 | Payer: MEDICARE

## 2020-05-10 NOTE — Telephone Encounter
I took a call from Glenn Montgomery daughter Glenn Montgomery and the hospice RN [place].  The patient had a long episode of VT yesterday in the monitor zone with rates in the 120s.  He was in this for ~7.5 hrs.  He was asymptomatic with the VT.  Mandi did see him yesterday and his only complaint was back pain, which is fairly common for him.     He is under hospice care although no one could really tell me why at this time.  Epic does not show who is managing the hospice.  I did ask if it was Glenn Montgomery wishes to have his ICD turned on at this time.  Glenn Montgomery said that she would be coming to town this evening and the family would have that discussion with him and they would discuss with [facility] tomorrow.     I did explain that the ICD will not treat (attempt to pace out of VT or shock him) until the VT is faster than 150 bpm.  He seemed to tolerate yesterday's VT fine.  I let them know that if Glenn Montgomery decides that he wants the ICD turned off, we can help facilitate that with [employer].  They will let us know tomorrow.

## 2020-05-10 NOTE — Telephone Encounter
Left message on son and patients home phone to have him go to the nearest ED to evaluate. Additional my chart message sent to advice him to go to the ED for evaluation. Triage line given to have patient call back to confirm message.

## 2020-05-10 NOTE — Telephone Encounter
-----   Message from Ninfa Meeker sent at 05/10/2020  8:39 AM CDT -----  Regarding: AJS Long VT epiosode in monitor zone  Please note that per remote alert received and reviewed this morning, patient presented with a long VT episode which occurred on 05/09/20 @ 0854.  Duration was noted as 7 hours 23 minutes 16 seconds,  V>A for  most of the duration of this episode with no true onset/term visible.  Ventricular rate is noted as 127 bpm.  Ventricular monitor zone is set to 115 bpm with therapies beginning for VT at 150bpm.  Presenting rhythm was AS-VS (NSR) 68 bpm.  CorVue trends do not appear suggestive of fluid retention at this time.  Complete report in chart for further detail/review.  Follow up as needed.      Thank you,  Terri/Device Team

## 2020-05-10 NOTE — Telephone Encounter
Casey Burkitt, RN routed conversation to You 26 minutes ago (10:06 AM)      Casey Burkitt, RN 27 minutes ago (10:05 AM)        I took a call from Mr. Schissler daughter Victorino December and the hospice RN [place].  The patient had a long episode of VT yesterday in the monitor zone with rates in the 120s.  He was in this for ~7.5 hrs.  He was asymptomatic with the VT.  Mandi did see him yesterday and his only complaint was back pain, which is fairly common for him.   ?  He is under hospice care although no one could really tell me why at this time.  Epic does not show who is managing the hospice.  I did ask if it was Mr. Hult wishes to have his ICD turned on at this time.  Lola said that she would be coming to town this evening and the family would have that discussion with him and they would discuss with [facility] tomorrow.   ?  I did explain that the ICD will not treat (attempt to pace out of VT or shock him) until the VT is faster than 150 bpm.  He seemed to tolerate yesterday's VT fine.  I let them know that if Mr. Skelley decides that he wants the ICD turned off, we can help facilitate that with [employer].  They will let us know tomorrow.

## 2020-05-16 ENCOUNTER — Encounter: Admit: 2020-05-16 | Discharge: 2020-05-16 | Payer: MEDICARE

## 2020-05-16 MED ORDER — SPIRONOLACTONE 25 MG PO TAB
ORAL_TABLET | Freq: Every day | 2 refills
Start: 2020-05-16 — End: ?

## 2020-05-16 NOTE — Telephone Encounter
Called and left message for son regarding ICD shock. Asked for call back from the son as patient is on Hospice and family was going to discuss with them if patient was to continue with ICD settings as they are. Left triage number for son to call back and discuss.

## 2020-05-16 NOTE — Telephone Encounter
-----   Message from Cammy Brochure, RN sent at 05/16/2020  7:57 AM CDT -----  Regarding: Remote Results - AJS  Multiple transmission from over the weekend were reviewed today for multiple alerts for VT/VF Episode Occurred & ATP Therapies  Delivered.     Patient had 5 VT-2 detection events with events lasting 2 min to 21.5 hours. Episodes occurred: 5/14 @ 0911; 5/14 @ 0913; 5/14 @ 0915; 5/14 @ 0931; 5/14 @ 1130.     Patient had 1 VT-1 (monitor) detected event that lasted 26 hours on 5/14 @ 1131.     Per note in the chart from 5/11 patient is under Hospice care, but there is no clear reason why. He still has his ICD therapies turned ON. We've reached out to the wife multiple times to discuss the need to address ICD therapies/need to turn off. Please follow up with the patient/family/Hospice Team. Thank you.

## 2020-05-25 ENCOUNTER — Encounter: Admit: 2020-05-25 | Discharge: 2020-05-25 | Payer: MEDICARE

## 2020-05-25 NOTE — Telephone Encounter
-----   Message from Gordy Savers sent at 05/25/2020  7:58 AM CDT -----  Regarding: Remote alert. VT in monitor zone.  A Yellow Alert was reported by the device: Alert for VT monitored episode. One new episode VT in the monitor zone 05/24/20 at 9:47 AM, lasted 18 seconds, 125 bpm. Also one non-sustained V episode 05/23/20 (No egm available), lasted 10 seconds, 134 bpm.    Additional Notes:  Normal device function. CorVue is stable. Battery 6.2 months remaining. Current rhythm AS-VS NSR 70 bpm. Pt is on hospice.

## 2020-05-25 NOTE — Telephone Encounter
Called and spoke with patients daughter in law. Pt is still on hospice and living in the hospice home. He was non-responsive yesterday when daughter in law was visiting. Asked if the family would like [place] to disable alerts on device, per family they do not want to turn off alerts and to keep device on.     Will continue to monitor.

## 2020-06-30 ENCOUNTER — Encounter: Admit: 2020-06-30 | Discharge: 2020-06-30 | Payer: MEDICARE

## 2020-06-30 NOTE — Telephone Encounter
-----   Message from Loura Halt sent at 06/30/2020 10:15 AM CDT -----  Regarding: pt of Dr [place] Bells Alert was reported by the device:  episodes in VT monitor zone  Presenting Rhythm: AS-VS 77 bpm    Stored EGMs are consistent with or suggestive of Supraventricular Tachycardia/Atach  AT burden: 1%  Total number of events: 20  Most recent on 06/30/2019 durations 36 sec - 1 min 52 sec.    Stored EGMs are consistent with or suggestive of Atach  Total episodes: 32  Number of ATP therapy: 9  Number of shocks delivered: 0  Most recent on 06/29/2020 durations 58 sec - 40 min 44 sec.  V rates 115-133 bpm    Stored EGMs are consistent with or suggestive of Atrial Tachycardia  AT Burden: 1%  Total number of episodes: 1 on 05/13/2020 duration 1 min 6 sec.    Please follow-up as needed

## 2020-06-30 NOTE — Telephone Encounter
Called and spoke with patients daughter on the phone. She said that she was able to talk with patient and he told her he was not having any pain, and he did not feel any events. Vitals were stable per nursing home staff. Pt compliant with medications. Will route to provider for review.

## 2020-06-30 NOTE — Telephone Encounter
Called and spoke with patients daughter, she said that pt was in nursing home and she hadn't heard any updates. She will call nursing home and we can call her back in 15 minutes for an update.

## 2020-07-05 ENCOUNTER — Encounter: Admit: 2020-07-05 | Discharge: 2020-07-05 | Payer: MEDICARE

## 2020-07-05 NOTE — Telephone Encounter
Glenn Montgomery, Evlyn Clines [facility]   Remote alert for VT in monitor zone. ?No therapy delivered. ?Episodes are long in duration with the most recent episode lasting ~36 ?hours. ?Presenting EGM was AS-VS 75 bpm (NSR). ?These episodes are becoming more frequent and longer in duration. ?Patient is on hospice. ?Per chart notes/encounters, it does not appear as if turning off device or alerts has been discussed any further since 05/25/20 but this conversation has taken place. ?Complete report in chart for further detail/review. ?Follow up as needed. ?     Thank you,   Terri/Device Team

## 2020-07-05 NOTE — Telephone Encounter
Attempting to reach contacts about remote alert. LMOM for son Nedra Hai to call back and update.

## 2020-07-11 ENCOUNTER — Encounter: Admit: 2020-07-11 | Discharge: 2020-07-11 | Payer: MEDICARE

## 2020-07-11 NOTE — Telephone Encounter
-----   Message from Cheri Guppy, RN sent at 07/11/2020  9:31 AM CDT -----  Regarding: More alerts for ST in the 117 bpm range.  Please review setting for detection with provider/ with known hospice. We could possible have rep change detection zones and not get alerts for ST, as pt would like to keep remote alerts on.   We want them to be actionable.       Let us know if we get orders to increase detection.   Please and thank you,  Candise Bowens

## 2020-07-11 NOTE — Telephone Encounter
Called and left message for son Nedra Hai regarding device settings on ICD. Triage number left to see if he would like device rep to change his settings.

## 2020-07-14 ENCOUNTER — Encounter: Admit: 2020-07-14 | Discharge: 2020-07-14 | Payer: MEDICARE

## 2020-07-14 NOTE — Telephone Encounter
-----   Message from Loura Halt sent at 07/14/2020  9:51 AM CDT -----  Regarding: Pt of Dr Kathreen Cosier  Additional alert for VT and SVT episodes Presenting rhythm: VT 126 bpm    Episodes with alert conditions    Sustained tachycardia event detected within programmed monitor zone  Total episodes: 4  Tachycardia Rate: 116-139 bpm    Pt on hospice not sure if follow-up is needed

## 2020-07-14 NOTE — Telephone Encounter
attempted to call family, no answer. Left message for callback.

## 2020-07-15 ENCOUNTER — Encounter: Admit: 2020-07-15 | Discharge: 2020-07-15 | Payer: MEDICARE

## 2020-07-15 NOTE — Progress Notes
Cletis Media, RN  P Mac Device  Please see response from [name] regarding alerts.     Thank you!   cara      ?  Previous Messages    ?  ----- Message -----   From: Lamont Snowball, MD   Sent: 07/15/2020 ?11:13 AM CDT   To: [facility]   Subject: RE: Device alerts ? ? ? ? ? ? ? ? ? ? ? ? ? ?     EP can turn off alerts if on hospice then.   _______________________  Completed on website.

## 2020-07-27 ENCOUNTER — Encounter: Admit: 2020-07-27 | Discharge: 2020-07-27 | Payer: MEDICARE

## 2020-07-28 ENCOUNTER — Encounter: Admit: 2020-07-28 | Discharge: 2020-07-28 | Payer: MEDICARE

## 2020-07-28 NOTE — Telephone Encounter
From: Griffith Citron, RN   Sent: 07/27/2020 ?12:49 PM CDT   To: [facility]   Subject: ongoing alerts for VF/VT ? ? ? ? ? ? ? ? ? ?     Just trying to clarify as some of the notes are very unclear... we continue to get alerts for long VT/SVT episodes... per chart review pt on hospice and VT/VF alerts can be turned off per Dr Kathreen Cosier ?(I did my best to turn off alerts for VT/VF episodes today so it should cut down on the daily remotes we are getting)     So for sure, this pt is on hospice?? As there's also a note saying he got a shock on 7/14?? If he is on hospice... may have better quality of life if shocks were turned off?? Tachy detection including shocks are still programmed on(is this typical??)     Thanks,   -Kaylee/Device Team

## 2020-07-28 NOTE — Telephone Encounter
Spoke with patient son, Nedra Hai. He confirmed his dad passed away this morning. Will update chart and providers.

## 2020-07-28 NOTE — Telephone Encounter
Disenrolled form [employer]

## 2020-07-28 NOTE — Progress Notes
POST-MORTEM DOCUMENTATION    Name: Glenn Montgomery        MRN: [id number]          DOB: 1928-02-23          Age: 86 y.o.  Admission Date: (Not on file)             LOS: 0 days      Date of death if known:  12-Aug-2020  Location of death, if known:Other  How were you notified?  Phone  Who notified us of death? Son Nedra Hai    Was hospice involved? Unknown  Name of hospice involved, if known: unknown  Date of hospice admission, if known: unknown    Other information:

## 2020-08-05 ENCOUNTER — Encounter: Admit: 2020-08-05 | Discharge: 2020-08-05 | Payer: MEDICARE

## 2020-08-16 ENCOUNTER — Encounter: Admit: 2020-08-16 | Discharge: 2020-08-16 | Payer: MEDICARE

## 2021-01-17 IMAGING — CT ABDOMEN_PELVIS WO(Adult)
2 of 3 series · 15 of 46 positions shown, 17 images · non-contrast
Comparison: none

[Series 2: abdomen_pelvis ax 5.00 br40 s3 · axial · 0.84mm/px · z∈[+1237,+1632]mm · 12 of 90 slices shown, 14 images]
[im 6/90  soft-tissue]
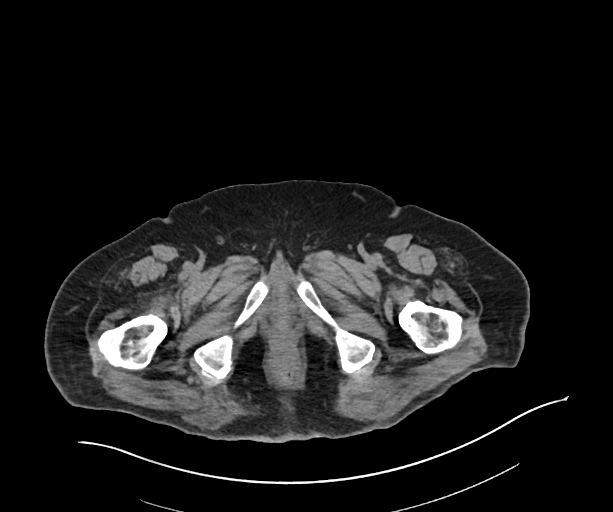
[im 6/90  bone]
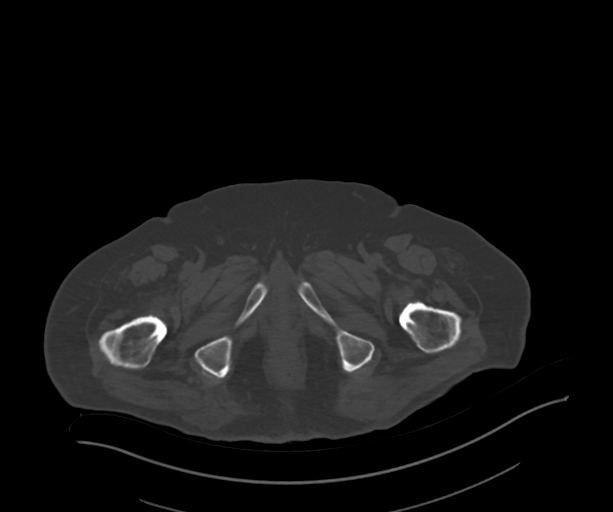
[im 12/90  soft-tissue]
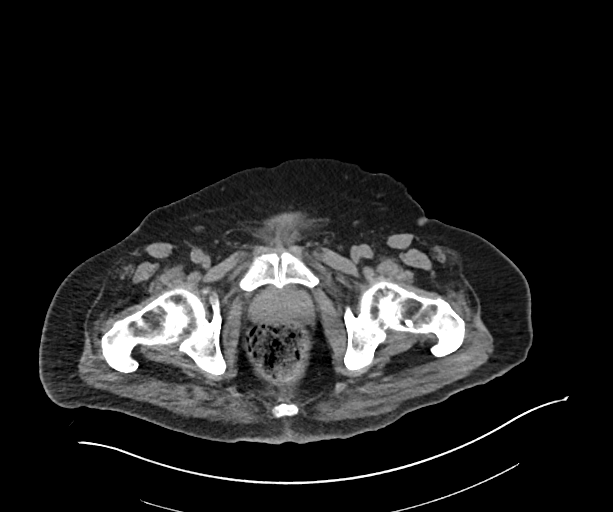
[im 21/90  soft-tissue]
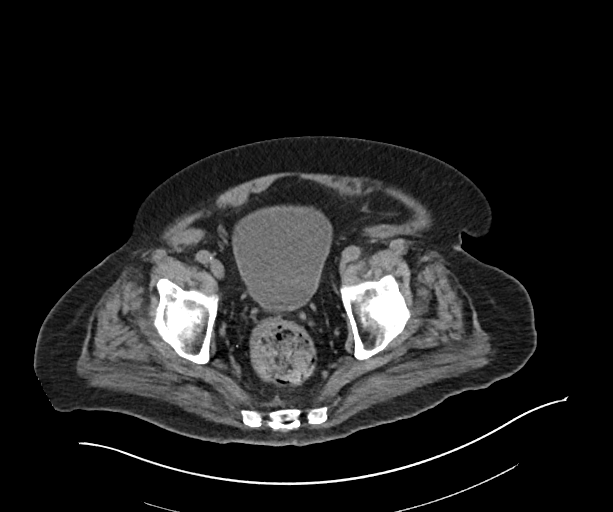
[im 26/90  soft-tissue]
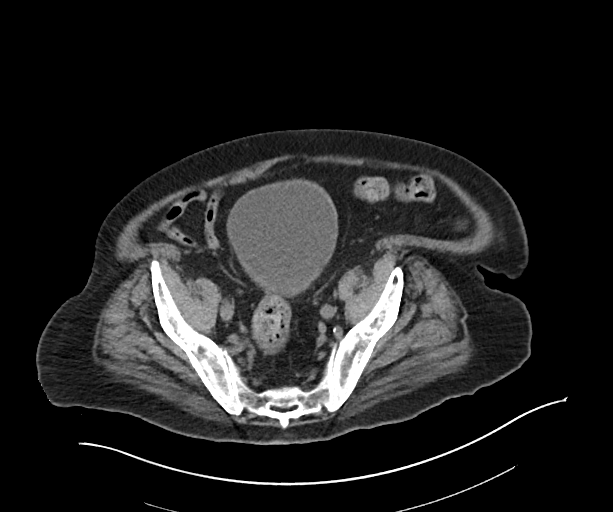
[im 35/90  soft-tissue]
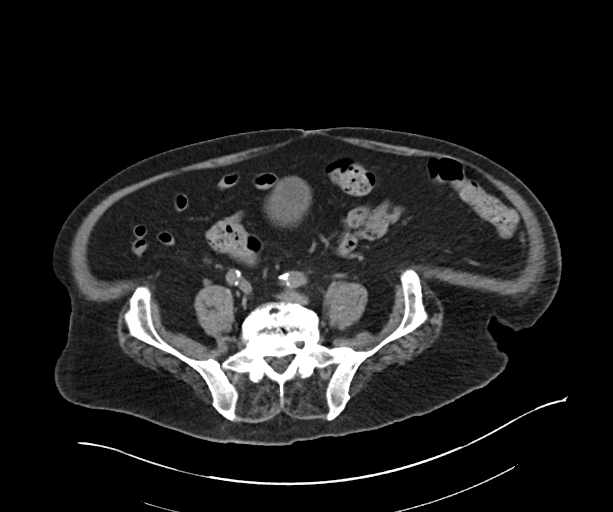
[im 41/90  soft-tissue]
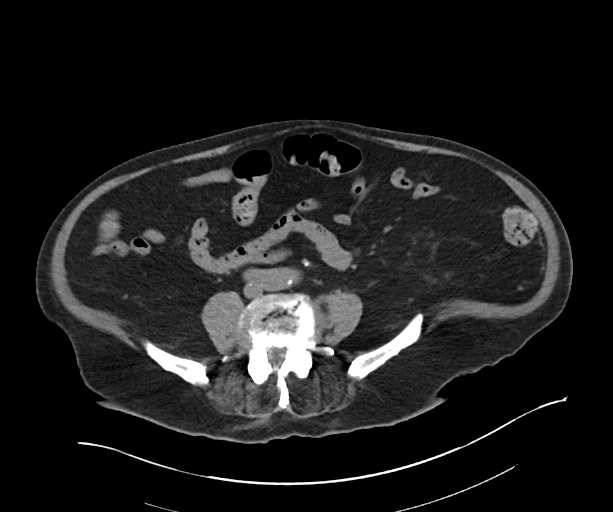
[im 49/90  soft-tissue]
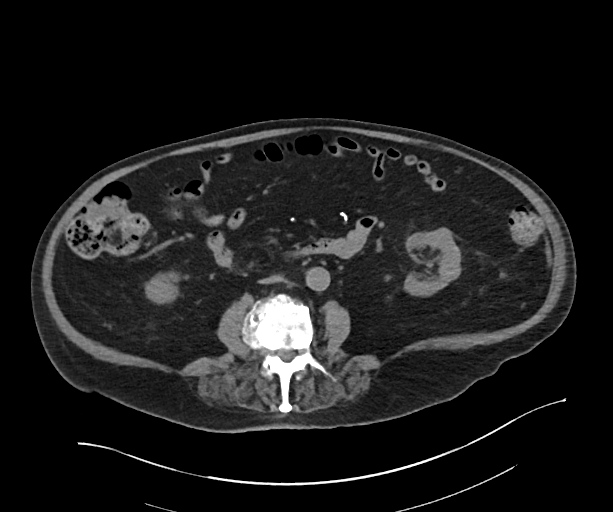
[im 55/90  soft-tissue]
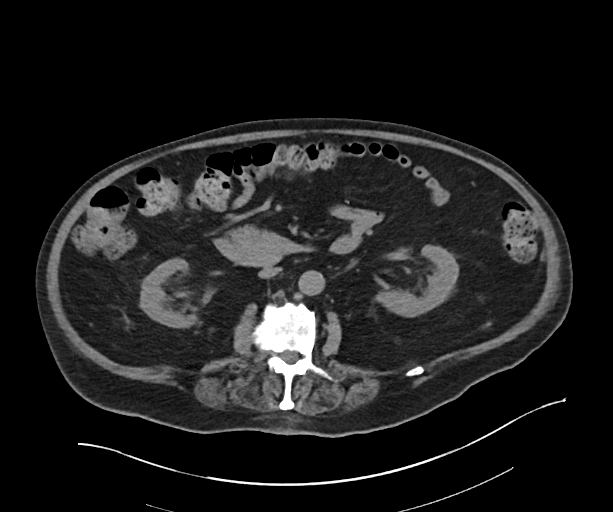
[im 64/90  soft-tissue]
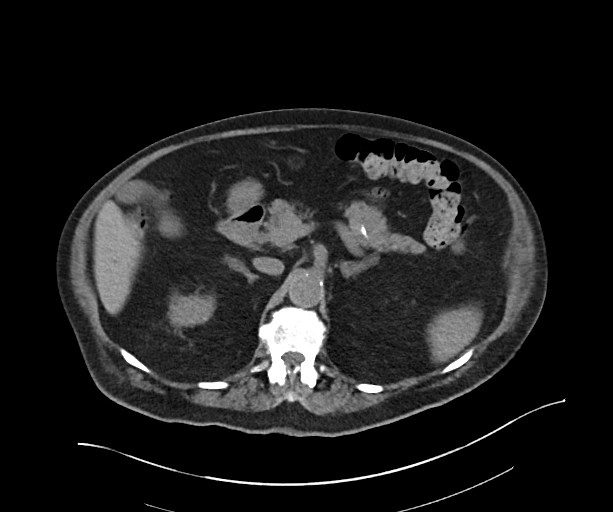
[im 64/90  bone]
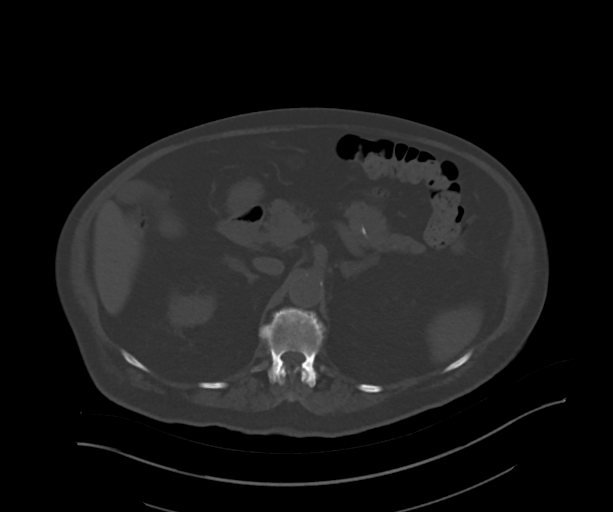
[im 69/90  soft-tissue]
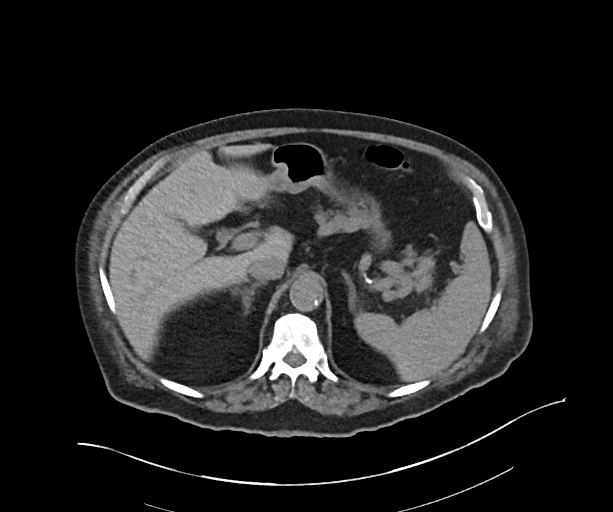
[im 78/90  soft-tissue]
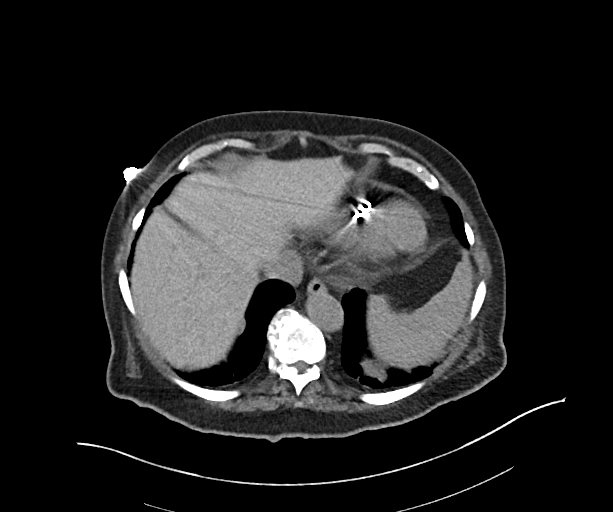
[im 84/90  soft-tissue]
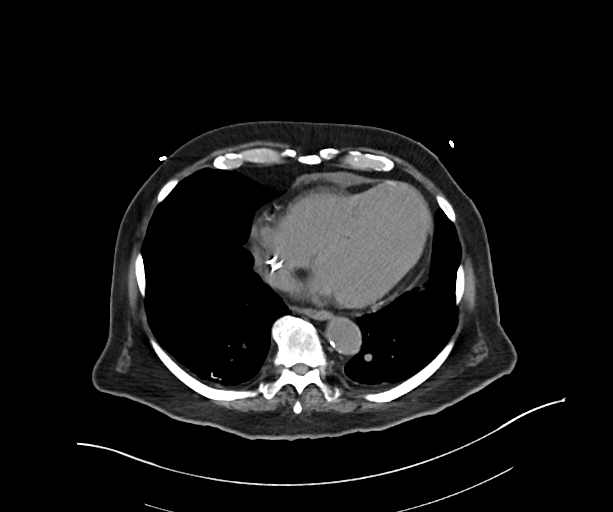

[Series 4: abdomen_pelvis cor 5.00 br40 s3 · coronal · 0.90mm/px · 3 of 85 slices shown]
[im 29/85  soft-tissue]
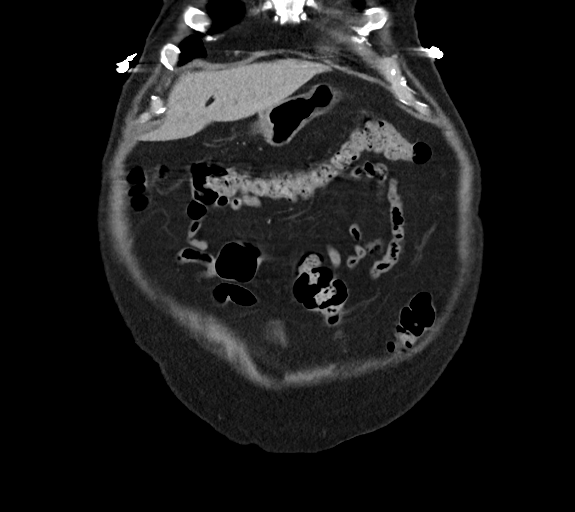
[im 38/85  soft-tissue]
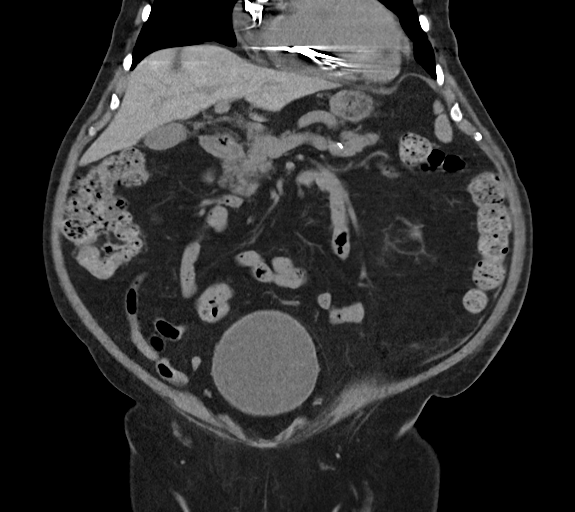
[im 47/85  soft-tissue]
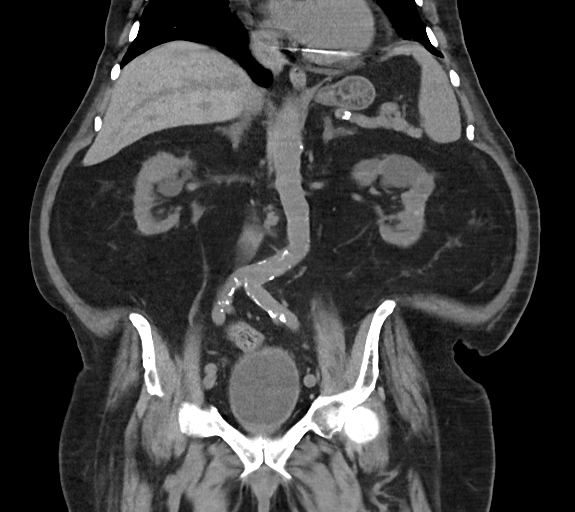

[15 of 46 positions shown; findings below may reference images not displayed]

10/13/19

EXAM

CT abdomen and pelvis with contrast.

INDICATION

LLQ pain

FINDINGS

CT of the abdomen and pelvis was performed without contrast.

All CT scans at this facility use dose modulation, iterative reconstruction, and/or weight based
dosing when appropriate to reduce radiation dose to as low as reasonably achievable. In the past 12
months, there have been 0 CT scans and 0 nuclear medicine myocardial perfusion scans.

There is a small pericardial effusion.

There are scattered bibasilar interstitial pulmonary opacities.

There is no focal abnormality liver, spleen or pancreas.  There are renal cortical cysts
bilaterally, largest involving the upper pole left kidney, measuring 4.0 x 3.9 cm in diameter.

There is no hydronephrosis or hydroureter bilaterally.

No ureteral calculi are identified.

The bladder contour is normal.

There is no bowel obstruction.  The appendix is normal.

There is a moderate amount of stool in the rectosigmoid colon.

There are calcifications of the prostate.

There is no acute bone lesion. There are degenerative changes throughout the lumbar spine.

IMPRESSION

There are no urinary tract calculi and there is no hydronephrosis.  There is a 4 cm left renal
cortical cyst.

There is no bowel obstruction or focal bowel wall abnormality.  The appendix is normal.  There are
there is a moderate amount of stool in the rectosigmoid colon.

There is a small pericardial effusion.

Tech Notes:

## 2021-01-17 IMAGING — CR CHEST
1 series · 1 of 1 positions shown · non-contrast
Comparison: none

[chest port x-wise]
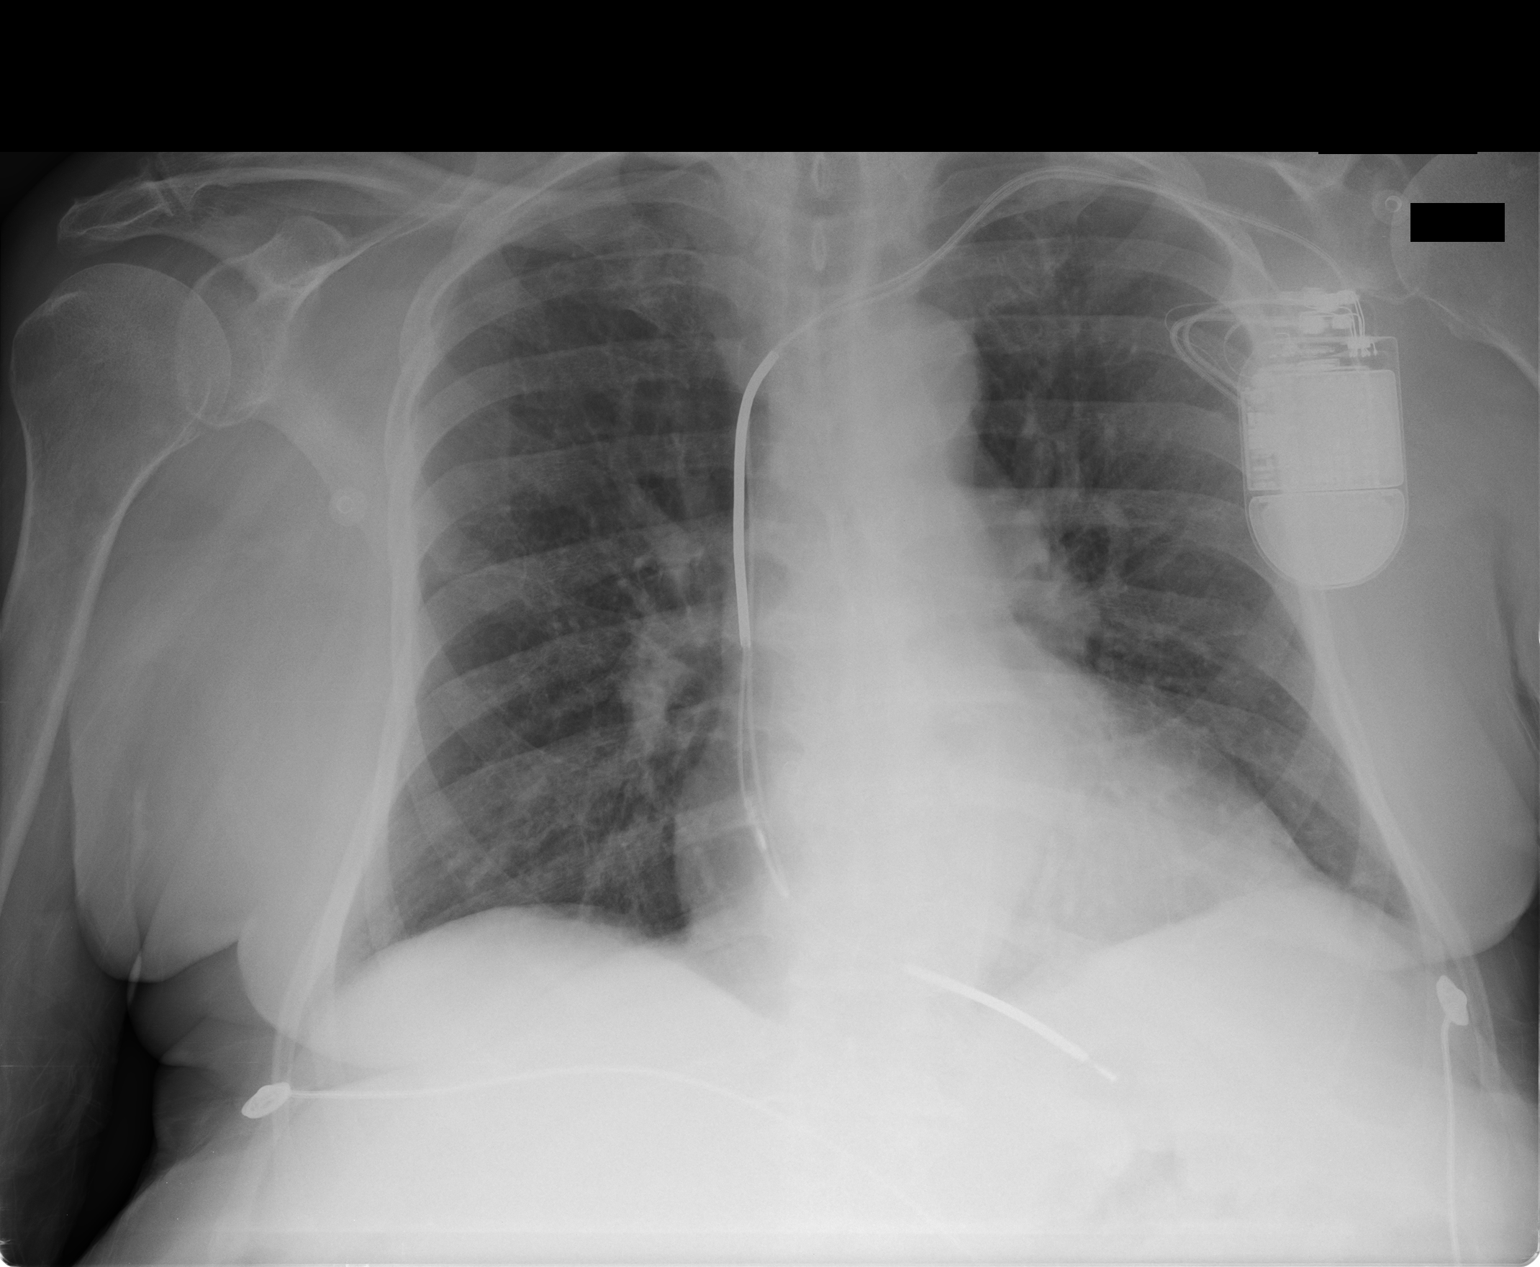

[1 of 1 positions shown; findings below may reference images not displayed]

10/13/19

DIAGNOSTIC STUDIES

EXAM

PORTABLE CHEST

INDICATION

arrhythmia
PT C/O PACE/DEFIB WENT OFF X 3 TODAY. COUGH,SOA. TJ

TECHNIQUE

AP view of the chest

COMPARISONS

None

FINDINGS

Dual lead pacemaker is in place. The aorta is elongated. The cardiac silhouette is mildly
enlarged. Pulmonary vasculature is within normal limits. The lung markings are slightly prominent
but no confluent infiltrate nor pleural effusions identified.

IMPRESSION

Prominent lung markings without confluent infiltrate.

Tech Notes:

PT C/O PACE/DEFIB WENT OFF X 3 TODAY. COUGH,SOA. TJ

## 2023-07-30 ENCOUNTER — Encounter: Admit: 2023-07-30 | Discharge: 2023-07-30 | Payer: MEDICARE
